# Patient Record
Sex: Male | Born: 1965 | Race: White | Hispanic: No | Marital: Married | Smoking: Former smoker
Health system: Southern US, Community
[De-identification: ages and names within clinical notes are randomized; demographics above are authoritative.]

## PROBLEM LIST (undated history)

## (undated) DIAGNOSIS — F419 Anxiety disorder, unspecified: Secondary | ICD-10-CM

## (undated) DIAGNOSIS — J309 Allergic rhinitis, unspecified: Secondary | ICD-10-CM

## (undated) DIAGNOSIS — F32A Depression, unspecified: Secondary | ICD-10-CM

## (undated) DIAGNOSIS — J45909 Unspecified asthma, uncomplicated: Secondary | ICD-10-CM

## (undated) DIAGNOSIS — T7840XA Allergy, unspecified, initial encounter: Secondary | ICD-10-CM

## (undated) DIAGNOSIS — F329 Major depressive disorder, single episode, unspecified: Secondary | ICD-10-CM

## (undated) HISTORY — DX: Anxiety disorder, unspecified: F41.9

## (undated) HISTORY — DX: Allergic rhinitis, unspecified: J30.9

## (undated) HISTORY — PX: COLONOSCOPY: SHX174

## (undated) HISTORY — PX: JOINT REPLACEMENT: SHX530

## (undated) HISTORY — DX: Major depressive disorder, single episode, unspecified: F32.9

## (undated) HISTORY — DX: Depression, unspecified: F32.A

## (undated) HISTORY — PX: NO PAST SURGERIES: SHX2092

## (undated) HISTORY — DX: Allergy, unspecified, initial encounter: T78.40XA

## (undated) HISTORY — DX: Unspecified asthma, uncomplicated: J45.909

---

## 2011-05-13 ENCOUNTER — Other Ambulatory Visit: Payer: Self-pay | Admitting: Physician Assistant

## 2011-06-21 ENCOUNTER — Other Ambulatory Visit: Payer: Self-pay | Admitting: Emergency Medicine

## 2011-09-16 ENCOUNTER — Other Ambulatory Visit: Payer: Self-pay | Admitting: Physician Assistant

## 2011-09-17 MED ORDER — MOMETASONE FUROATE 50 MCG/ACT NA SUSP: 2.0000 | Freq: Every day | NASAL | Status: DC

## 2011-09-17 NOTE — Addendum Note (Signed)
Addended by: Jacqualyn Posey on: 09/17/2011 04:05 PM   Modules accepted: Orders

## 2011-10-16 ENCOUNTER — Ambulatory Visit (INDEPENDENT_AMBULATORY_CARE_PROVIDER_SITE_OTHER): Payer: BC Managed Care – PPO | Admitting: Physician Assistant

## 2011-10-16 VITALS — BP 116/78 | HR 62 | Temp 98.3°F | Resp 16 | Ht 72.5 in | Wt 225.6 lb

## 2011-10-16 DIAGNOSIS — F341 Dysthymic disorder: Secondary | ICD-10-CM

## 2011-10-16 DIAGNOSIS — F418 Other specified anxiety disorders: Secondary | ICD-10-CM

## 2011-10-16 DIAGNOSIS — J309 Allergic rhinitis, unspecified: Secondary | ICD-10-CM

## 2011-10-16 MED ORDER — PAROXETINE HCL 20 MG PO TABS: 20.0000 mg | ORAL_TABLET | Freq: Every day | ORAL | Status: DC

## 2011-10-16 MED ORDER — MOMETASONE FUROATE 50 MCG/ACT NA SUSP: 2.0000 | Freq: Every day | NASAL | Status: DC

## 2011-10-16 NOTE — Progress Notes (Signed)
Patient ID: Doc Mandala MRN: [id number], DOB: October 18, 1965, 46 y.o. Date of Encounter: 10/16/2011, 11:01 AM  Primary Physician: No primary provider on file.  Chief Complaint: Medication refill  HPI: 46 y.o. year old male with history below presents for medication refill. 1) Depression with anxiety well controlled with current treatment. Taking Paxil 20 mg daily without issues. Has not needed Clonazepam recently. Staying busy at work and home. Has 4 children age ranges 57 to a few months old. Enjoying life. No SI/HI.  2) Allergic rhinitis well controlled with current treatment. Taking Nasonex 2 sprays daily without issues. Really helping control his allergies. No side effects.     Past Medical History  Diagnosis Date  . Anxiety   . Depression   . Allergic rhinitis      Home Meds: Prior to Admission medications   Medication Sig Start Date End Date Taking? Authorizing Provider  mometasone (NASONEX) 50 MCG/ACT nasal spray Place 2 sprays into the nose daily. 10/16/11 10/15/12 Yes Abigial Newville M Akya Fiorello, PA-C  PARoxetine (PAXIL) 20 MG tablet Take 1 tablet (20 mg total) by mouth daily. 10/16/11  Yes Lynnmarie Lovett M Dacari Beckstrand, PA-C    Allergies: No Known Allergies  History   Social History  . Marital Status: Married    Spouse Name: N/A    Number of Children: N/A  . Years of Education: N/A   Occupational History  . Not on file.   Social History Main Topics  . Smoking status: Former Games developer  . Smokeless tobacco: Not on file  . Alcohol Use: Not on file  . Drug Use: Not on file  . Sexually Active: Not on file   Other Topics Concern  . Not on file   Social History Narrative  . No narrative on file     Review of Systems: Constitutional: negative for chills, fever, night sweats, weight changes, or fatigue  HEENT: negative for vision changes, hearing loss, congestion, rhinorrhea, ST, epistaxis, or sinus pressure Cardiovascular: negative for chest pain or palpitations Respiratory: negative for  hemoptysis, wheezing, shortness of breath, or cough Abdominal: negative for abdominal pain, nausea, vomiting, diarrhea, or constipation Dermatological: negative for rash Neurologic: negative for headache, dizziness, or syncope All other systems reviewed and are otherwise negative with the exception to those above and in the HPI.   Physical Exam: Blood pressure 116/78, pulse 62, temperature 98.3 F (36.8 C), temperature source Oral, resp. rate 16, height 6' 0.5" (1.842 m), weight 225 lb 9.6 oz (102.331 kg), SpO2 98.00%., Body mass index is 30.18 kg/(m^2). General: Well developed, well nourished, in no acute distress. Head: Normocephalic, atraumatic, eyes without discharge, sclera non-icteric, nares are without discharge. Bilateral auditory canals clear, TM's are without perforation, pearly grey and translucent with reflective cone of light bilaterally. Oral cavity moist, posterior pharynx without exudate, erythema, peritonsillar abscess, or post nasal drip.  Neck: Supple. No thyromegaly. Full ROM. No lymphadenopathy. Lungs: Clear bilaterally to auscultation without wheezes, rales, or rhonchi. Breathing is unlabored. Heart: RRR with S1 S2. No murmurs, rubs, or gallops appreciated. Msk:  Strength and tone normal for age. Extremities/Skin: Warm and dry. No clubbing or cyanosis. No edema. No rashes or suspicious lesions. Neuro: Alert and oriented X 3. Moves all extremities spontaneously. Gait is normal. CNII-XII grossly in tact. Psych:  Responds to questions appropriately with a normal affect.     ASSESSMENT AND PLAN:  46 y.o. year old male with depression,anxiety, and allergic rhinitis 1. Depression/anxiety -Well controlled -Continue current treatment -Paxil 20 mg 1 po  daily #90 RF 4  2. Allergic rhinitis -Well controlled -Continue current treatment -Nasonex 2 sprays daily #1 RF 11  Signed, Eula Listen, PA-C 10/16/2011 11:01 AM

## 2011-11-18 ENCOUNTER — Ambulatory Visit (INDEPENDENT_AMBULATORY_CARE_PROVIDER_SITE_OTHER): Payer: BC Managed Care – PPO | Admitting: Family Medicine

## 2011-11-18 VITALS — BP 120/72 | HR 67 | Temp 98.2°F | Resp 16 | Ht 73.0 in | Wt 225.4 lb

## 2011-11-18 DIAGNOSIS — M659 Synovitis and tenosynovitis, unspecified: Secondary | ICD-10-CM

## 2011-11-18 DIAGNOSIS — M7750 Other enthesopathy of unspecified foot: Secondary | ICD-10-CM

## 2011-11-18 DIAGNOSIS — M65979 Unspecified synovitis and tenosynovitis, unspecified ankle and foot: Secondary | ICD-10-CM

## 2011-11-18 MED ORDER — PREDNISONE 20 MG PO TABS: ORAL_TABLET | ORAL | Status: DC

## 2011-11-18 NOTE — Progress Notes (Signed)
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  It worsened after kicking a soccer ball this weekend.  He has a h/o a fracture in that area. Has taken no meds  Objective:  NAD Normal inspection of foot.  Mildly tender over insertion of peroneus longus. Full ROM No bony abnormality.  Assessment: tendonitis  Plan:   1. Tendonitis of foot  predniSONE (DELTASONE) 20 MG tablet

## 2011-12-04 ENCOUNTER — Ambulatory Visit (INDEPENDENT_AMBULATORY_CARE_PROVIDER_SITE_OTHER): Payer: BC Managed Care – PPO | Admitting: Family Medicine

## 2011-12-04 VITALS — BP 111/63 | HR 69 | Temp 98.4°F | Resp 20 | Ht 73.0 in | Wt 223.0 lb

## 2011-12-04 DIAGNOSIS — G4700 Insomnia, unspecified: Secondary | ICD-10-CM

## 2011-12-04 DIAGNOSIS — F341 Dysthymic disorder: Secondary | ICD-10-CM

## 2011-12-04 DIAGNOSIS — F418 Other specified anxiety disorders: Secondary | ICD-10-CM | POA: Insufficient documentation

## 2011-12-04 MED ORDER — CLONAZEPAM 0.5 MG PO TABS: 0.5000 mg | ORAL_TABLET | Freq: Two times a day (BID) | ORAL | Status: DC | PRN

## 2011-12-04 NOTE — Progress Notes (Signed)
  Subjective:    Patient ID: Brian Morrison, male    DOB: 1965-10-19, 46 y.o.   MRN: [id number]  HPI Brian Morrison is a 46 y.o. male Hx of depression - last seen 10/16/11 - sx's controlled with paxil 20mg  qd at that time and had not needed clonazepam.    New baby at home - 20 month old, also has 15yo, 44yo, and 44 yo.  New addition on the house, marital problems. Strain of the new baby has been strain on marriage.  Only went to counseling years ago.  Would like to meet with a counselor.  Still taking paxil.  Has not taken clonazepam in awhile.   Alcohol - beer here and there - only one.  No recent increase in ETOH, no illicit drug use.  Trouble sleeping - getting to sleep, staying asleep, waking up thinking of stressors and difficult to go back to sleep.  Sales rep. Custom manufactured products.    Review of Systems  Psychiatric/Behavioral: Positive for disturbed wake/sleep cycle. Negative for suicidal ideas. The patient is nervous/anxious.        Objective:   Physical Exam  Constitutional: He appears well-developed.  HENT:  Head: Normocephalic and atraumatic.  Neck: No thyromegaly present.  Cardiovascular: Normal rate, regular rhythm, normal heart sounds and intact distal pulses.   Skin: Skin is warm and dry.  Psychiatric: He has a normal mood and affect. His speech is normal and behavior is normal. Judgment and thought content normal. Cognition and memory are normal. He expresses no suicidal ideation.       Somewhat flat affect, but good eye contact. Minimal PMA few times during visit.  No SI.           Assessment & Plan:  Brian Morrison is a 46 y.o. male 1. Depression with anxiety  clonazePAM (KLONOPIN) 0.5 MG tablet  2. Insomnia  clonazePAM (KLONOPIN) 0.5 MG tablet   Depression with anxiety - recent worsening with marital stressors, new baby. Insomnia.  Discussed stress management techniques, discussed counseling - individual and marital -amenable to individual  counseling at present.   Phone numbers given for [facility] rtc precautions.    Patient Instructions  You can call [facility] or [facility] at [phone].  Their office is at [address], [address]    Let me know if other names/numbers needed. Ou can take 1/2 up to 2 clonazepam at bedtime if needed. Work on the Medical illustrator we discussed.  Return to the clinic or go to the nearest emergency room if any of your symptoms worsen or new symptoms occur.

## 2011-12-04 NOTE — Patient Instructions (Addendum)
You can call [facility] or [facility] at [phone].  Their office is at [address], [address].    Let me know if other names/numbers needed. Ou can take 1/2 up to 2 clonazepam at bedtime if needed. Work on the Medical illustrator we discussed. If not improving in next few weeks, follow up to discuss further options. Return to the clinic or go to the nearest emergency room if any of your symptoms worsen or new symptoms occur.

## 2011-12-31 ENCOUNTER — Ambulatory Visit (INDEPENDENT_AMBULATORY_CARE_PROVIDER_SITE_OTHER): Payer: BC Managed Care – PPO | Admitting: Family Medicine

## 2011-12-31 VITALS — BP 132/82 | HR 96 | Temp 98.3°F | Resp 20 | Ht 73.0 in | Wt 216.4 lb

## 2011-12-31 DIAGNOSIS — J029 Acute pharyngitis, unspecified: Secondary | ICD-10-CM

## 2011-12-31 MED ORDER — AMOXICILLIN 875 MG PO TABS: 875.0000 mg | ORAL_TABLET | Freq: Two times a day (BID) | ORAL | Status: DC

## 2011-12-31 MED ORDER — MAGIC MOUTHWASH W/LIDOCAINE: 5.0000 mL | Freq: Four times a day (QID) | ORAL | Status: DC | PRN

## 2011-12-31 NOTE — Patient Instructions (Addendum)
Faringitis (viral y bacteriana) (Pharyngitis, Viral and Bacterial) Es una inflamacin (irritacin) o infeccin de la faringe. Tambin se denomina dolor de garganta.  CAUSAS La mayor parte de las anginas son causadas por virus y son parte de un resfro. Sin embargo, algunas anginas son ocasionadas por la bacteria estreptococo (germen) o por otras bacterias. La causa de la angina tambin puede ser el goteo nasal proveniente de los senos paranasales, alergias y, en algunos casos, se produce incluso por dormir con la boca abierta. Las anginas infecciosas pueden contagiarse de una persona a otra por la tos, el estornudo y por compartir tasas o cubiertos. TRATAMIENTO Las anginas de causa viral generalmente duran entre 3 y 4 das. Estas infecciones virales generalmente mejoran sin antibiticos (medicamentos que destruyen grmenes). Las anginas por estreptococo y otras bacterias (grmenes) generalmente mejoran despus de 24 a 48 horas de comenzar el tratamiento con antibiticos. INSTRUCCIONES PARA EL CUIDADO DOMICILIARIO  Si en profesional considera que se trata de una infeccin bacteriana o si hay una prueba positiva para el estreptococo, le prescribir un antibitico. Debe tomar todos los antibiticos hasta completar el tratamiento. Si no completa el tratamiento con antibiticos, usted o su hijo podrn enfermar nuevamente. Si usted o su hijo presentan un estreptococo en la garganta y no completan el tratamiento, podran sufrir un trastorno grave en el corazn o en los riones.   Beba gran cantidad de lquidos. Alrededor de 8-10 vasos grandes de agua por da. (Puede ser agua, jugos, licuados de frutas, Kool-aid, Gatorade, gaseosas, etc.).   Utilice los medicamentos de venta libre o de prescripcin para el dolor, el malestar o la fiebre, segn se lo indique el profesional que lo asiste.   Descanse lo suficiente.   Hgase grgaras con agua salada (1/2 cucharadita de sal en un vaso de agua) cada 1-2 horas si  lo necesita para aliviar las molestias.   Si el paciente es mayor de siete aos, ofrzcale caramelos duros o aerosoles o pastillas para la tos.  SOLICITE ATENCIN MDICA SI:  Si le aparecen bultos grandes y dolorosos en el cuello.   Presenta una erupcin.   Elimina un esputo verde, marrn-amarillento o sanguinolento.   El beb tiene ms de 3 meses y su temperatura rectal es de 100.5 F (38.1 C) o ms durante ms de 1 da.  SOLICITE ATENCIN MDICA DE INMEDIATO SI:  Siente rigidez en el cuello.   Usted o su hijo babean o no pueden tragar lquidos.   Usted o su hijo vomitan, no pueden retener los medicamentos o los lquidos.   Usted o su hijo presentan dolor intenso, que no se alivia con los medicamentos que le han recetado.   Usted o su hijo presentan dificultad para respirar (y no se debe a la nariz congestionada).   Usted o su hijo no pueden abrir la boca completamente.   Usted o su nio presentan aumento del dolor, hinchazn, enrojecimiento en el cuello.   Tiene fiebre.   Su beb tiene ms de 3 meses y su temperatura rectal es de 102 F (38.9 C) o ms.   Su beb tiene 3 meses o menos y su temperatura rectal es de 100.4 F (38 C) o ms.  EST SEGURO QUE:   Comprende las instrucciones para el alta mdica.   Controlar su enfermedad.   Solicitar atencin mdica de inmediato segn las indicaciones.  Document Released: 11/28/2004 Document Revised: 02/07/2011 ExitCare Patient Information 2012 [employer]. 

## 2011-12-31 NOTE — Progress Notes (Signed)
@[email or web address] @   Patient ID: Brian Morrison MRN: [id number], DOB: [birth year], 46 y.o. Date of Encounter: 12/31/2011, 11:06 AM  Primary Physician: Tally Due, MD  Chief Complaint:  Chief Complaint  Patient presents with  . Sore Throat    X4days (exposed to strep)  . Chills  . Fatigue  . Nasal Congestion    HPI: 46 y.o. year old male presents with 4 day history of sore throat. Subjective low grade temperature and chills. No cough, congestion, rhinorrhea, sinus pressure, otalgia, or headache. Normal hearing. No GI complaints. Able to swallow saliva, but hurts to do so. Decreased appetite secondary to sore throat.   Past Medical History  Diagnosis Date  . Anxiety   . Depression   . Allergic rhinitis   . Asthma      Home Meds: Prior to Admission medications   Medication Sig Start Date End Date Taking? Authorizing Provider  mometasone (NASONEX) 50 MCG/ACT nasal spray Place 2 sprays into the nose daily. 10/16/11 10/15/12 Yes Ryan M Dunn, PA-C  PARoxetine (PAXIL) 20 MG tablet Take 1 tablet (20 mg total) by mouth daily. 10/16/11  Yes Ryan M Dunn, PA-C  clonazePAM (KLONOPIN) 0.5 MG tablet Take 1 tablet (0.5 mg total) by mouth 2 (two) times daily as needed for anxiety. 12/04/11   Shade Flood, MD  predniSONE (DELTASONE) 20 MG tablet 2 daily with food 11/18/11   Elvina Sidle, MD    Allergies: No Known Allergies  History   Social History  . Marital Status: Married    Spouse Name: N/A    Number of Children: N/A  . Years of Education: N/A   Occupational History  . Not on file.   Social History Main Topics  . Smoking status: Former Smoker    Start date: 12/03/1997  . Smokeless tobacco: Never Used  . Alcohol Use: Not on file  . Drug Use: Not on file  . Sexually Active: Not on file   Other Topics Concern  . Not on file   Social History Narrative  . No narrative on file     Review of Systems: Constitutional: negative for chills, fever, night sweats or weight  changes HEENT: see above Cardiovascular: negative for chest pain or palpitations Respiratory: negative for hemoptysis, wheezing, or shortness of breath Abdominal: negative for abdominal pain, nausea, vomiting or diarrhea Dermatological: negative for rash Neurologic: negative for headache   Physical Exam: Blood pressure 132/82, pulse 96, temperature 98.3 F (36.8 C), temperature source Oral, resp. rate 20, height 6\' 1"  (1.854 m), weight 216 lb 6.4 oz (98.158 kg), SpO2 96.00%., Body mass index is 28.55 kg/(m^2). General: Well developed, well nourished, in no acute distress. Head: Normocephalic, atraumatic, eyes without discharge, sclera non-icteric, nares are patent. Bilateral auditory canals clear, TM's are without perforation, pearly grey with reflective cone of light bilaterally. No sinus TTP. Oral cavity moist, dentition normal. Posterior pharynx with post nasal drip and mild erythema. No peritonsillar abscess or tonsillar exudate. Neck: Supple. No thyromegaly. Full ROM. No lymphadenopathy. Lungs: Clear bilaterally to auscultation without wheezes, rales, or rhonchi. Breathing is unlabored. Heart: RRR with S1 S2. No murmurs, rubs, or gallops appreciated. Abdomen: Soft, non-tender, non-distended with normoactive bowel sounds. No hepatomegaly. No rebound/guarding. No obvious abdominal masses. Msk:  Strength and tone normal for age. Extremities: No clubbing or cyanosis. No edema. Neuro: Alert and oriented X 3. Moves all extremities spontaneously. CNII-XII grossly in tact. Psych:  Responds to questions appropriately with a normal affect.   Labs:  ASSESSMENT AND PLAN:  46 y.o. year old male with  - -Tylenol/Motrin prn -Rest/fluids -RTC precautions -RTC 3-5 days if no improvement  Signed, Elvina Sidle, MD 12/31/2011 11:06 AM

## 2012-01-11 ENCOUNTER — Ambulatory Visit: Payer: BC Managed Care – PPO

## 2012-01-11 ENCOUNTER — Ambulatory Visit (INDEPENDENT_AMBULATORY_CARE_PROVIDER_SITE_OTHER): Payer: BC Managed Care – PPO | Admitting: Family Medicine

## 2012-01-11 VITALS — BP 122/73 | HR 69 | Temp 98.1°F | Resp 16 | Ht 73.0 in | Wt 215.0 lb

## 2012-01-11 DIAGNOSIS — R05 Cough: Secondary | ICD-10-CM

## 2012-01-11 DIAGNOSIS — J40 Bronchitis, not specified as acute or chronic: Secondary | ICD-10-CM

## 2012-01-11 DIAGNOSIS — R059 Cough, unspecified: Secondary | ICD-10-CM

## 2012-01-11 DIAGNOSIS — J329 Chronic sinusitis, unspecified: Secondary | ICD-10-CM

## 2012-01-11 DIAGNOSIS — M7750 Other enthesopathy of unspecified foot: Secondary | ICD-10-CM

## 2012-01-11 LAB — POCT CBC
Granulocyte percent: 59.7 %G (ref 37–80)
HCT, POC: 50.6 % (ref 43.5–53.7)
Hemoglobin: 15.8 g/dL (ref 14.1–18.1)
Lymph, poc: 3.4 (ref 0.6–3.4)
MCH, POC: 29.5 pg (ref 27–31.2)
MCHC: 31.2 g/dL — AB (ref 31.8–35.4)
MCV: 94.6 fL (ref 80–97)
MID (cbc): 0.7 (ref 0–0.9)
MPV: 10.6 fL (ref 0–99.8)
POC Granulocyte: 6 (ref 2–6.9)
POC LYMPH PERCENT: 33.7 %L (ref 10–50)
POC MID %: 6.6 %M (ref 0–12)
Platelet Count, POC: 296 10*3/uL (ref 142–424)
RBC: 5.35 M/uL (ref 4.69–6.13)
RDW, POC: 13.4 %
WBC: 10.1 10*3/uL (ref 4.6–10.2)

## 2012-01-11 MED ORDER — PREDNISONE 20 MG PO TABS: ORAL_TABLET | ORAL | Status: DC

## 2012-01-11 MED ORDER — BENZONATATE 100 MG PO CAPS: ORAL_CAPSULE | ORAL | Status: DC

## 2012-01-11 MED ORDER — HYDROCODONE-HOMATROPINE 5-1.5 MG/5ML PO SYRP: 5.0000 mL | ORAL_SOLUTION | ORAL | Status: DC | PRN

## 2012-01-11 MED ORDER — AZITHROMYCIN 250 MG PO TABS: ORAL_TABLET | ORAL | Status: DC

## 2012-01-11 NOTE — Progress Notes (Signed)
Subjective: Has continued to be ill since been treated couple of weeks ago. He has had congestion, purulent drainage, postnasal drainage, and cough. Cough bothers him some at night. He has had a low-grade fever.  Objective: TMs normal. Nose mildly congested. Throat clear. Neck supple without significant nodes. Chest clear to auscultation. Heart regular without murmurs.  Assessment: Bronchitis Cough Sinusitis  Plan: Chest x-ray CBC Results for orders placed in visit on 01/11/12  POCT CBC      Component Value Range   WBC 10.1  4.6 - 10.2 K/uL   Lymph, poc 3.4  0.6 - 3.4   POC LYMPH PERCENT 33.7  10 - 50 %L   MID (cbc) 0.7  0 - 0.9   POC MID % 6.6  0 - 12 %M   POC Granulocyte 6.0  2 - 6.9   Granulocyte percent 59.7  37 - 80 %G   RBC 5.35  4.69 - 6.13 M/uL   Hemoglobin 15.8  14.1 - 18.1 g/dL   HCT, POC 82.9  56.2 - 53.7 %   MCV 94.6  80 - 97 fL   MCH, POC 29.5  27 - 31.2 pg   MCHC 31.2 (*) 31.8 - 35.4 g/dL   RDW, POC 13.0     Platelet Count, POC 296  142 - 424 K/uL   MPV 10.6  0 - 99.8 fL   [facility] reading (PRIMARY) by  Dr. Alwyn Ren NOrmal cxr. . See orders. zithromax prednisone

## 2012-01-11 NOTE — Patient Instructions (Signed)
Drink plenty of fluids  Use cough syrup at nighttime on weekends. TheTessalon can be used when you are at work or having to drive.  Return if worse.

## 2012-04-17 ENCOUNTER — Ambulatory Visit (INDEPENDENT_AMBULATORY_CARE_PROVIDER_SITE_OTHER): Payer: BC Managed Care – PPO | Admitting: Emergency Medicine

## 2012-04-17 VITALS — BP 120/77 | HR 77 | Temp 98.5°F | Resp 16 | Ht 74.0 in | Wt 223.0 lb

## 2012-04-17 DIAGNOSIS — F32A Depression, unspecified: Secondary | ICD-10-CM

## 2012-04-17 DIAGNOSIS — H6590 Unspecified nonsuppurative otitis media, unspecified ear: Secondary | ICD-10-CM

## 2012-04-17 DIAGNOSIS — F329 Major depressive disorder, single episode, unspecified: Secondary | ICD-10-CM

## 2012-04-17 DIAGNOSIS — H6592 Unspecified nonsuppurative otitis media, left ear: Secondary | ICD-10-CM

## 2012-04-17 DIAGNOSIS — F3289 Other specified depressive episodes: Secondary | ICD-10-CM

## 2012-04-17 MED ORDER — DULOXETINE HCL 30 MG PO CPEP: 30.0000 mg | ORAL_CAPSULE | Freq: Every day | ORAL | Status: DC

## 2012-04-17 MED ORDER — PSEUDOEPHEDRINE-GUAIFENESIN ER 60-600 MG PO TB12: 1.0000 | ORAL_TABLET | Freq: Two times a day (BID) | ORAL | Status: DC

## 2012-04-17 MED ORDER — AMOXICILLIN 500 MG PO CAPS: 500.0000 mg | ORAL_CAPSULE | Freq: Three times a day (TID) | ORAL | Status: DC

## 2012-04-17 NOTE — Progress Notes (Signed)
[facility] [address] [place] [address] [phone]  Date:  04/17/2012   Name:  Brian Morrison   DOB:  Aug 10, 1965   MRN:  [id number]  PCP:  Tally Due, MD    Chief Complaint: Otalgia   History of Present Illness:  Brian Morrison is a 47 y.o. very pleasant male patient who presents with the following:  Pain in left ear after using a q-tip to clean his ear. Some bloody drainage.  No fever or chills, cough or coryza.  Continues to be depressed and his therapist suggested that he change medications to wellbutrin   Patient Active Problem List  Diagnosis  . Depression with anxiety    Past Medical History  Diagnosis Date  . Anxiety   . Depression   . Allergic rhinitis   . Asthma     History reviewed. No pertinent past surgical history.  History  Substance Use Topics  . Smoking status: Former Smoker    Start date: 12/03/1997  . Smokeless tobacco: Never Used  . Alcohol Use: Yes    Family History  Problem Relation Age of Onset  . COPD Mother     No Known Allergies  Medication list has been reviewed and updated.  Current Outpatient Prescriptions on File Prior to Visit  Medication Sig Dispense Refill  . clonazePAM (KLONOPIN) 0.5 MG tablet Take 1 tablet (0.5 mg total) by mouth 2 (two) times daily as needed for anxiety.  20 tablet  1  . mometasone (NASONEX) 50 MCG/ACT nasal spray Place 2 sprays into the nose daily.  17 g  11  . PARoxetine (PAXIL) 20 MG tablet Take 1 tablet (20 mg total) by mouth daily.  90 tablet  4  . Alum & Mag Hydroxide-Simeth (MAGIC MOUTHWASH W/LIDOCAINE) SOLN Take 5 mLs by mouth 4 (four) times daily as needed.  120 mL  0  . amoxicillin (AMOXIL) 875 MG tablet Take 1 tablet (875 mg total) by mouth 2 (two) times daily.  20 tablet  0  . azithromycin (ZITHROMAX) 250 MG tablet Take 2 initiallly, then one daily for 4 days  6 tablet  0  . benzonatate (TESSALON) 100 MG capsule Use 1-2 tablets 3 times daily as  necessary for cough. May be used with other cough medicines if needed.  30 capsule  0  . HYDROcodone-homatropine (HYCODAN) 5-1.5 MG/5ML syrup Take 5 mLs by mouth every 4 (four) hours as needed for cough.  120 mL  0  . predniSONE (DELTASONE) 20 MG tablet 2 daily with food  10 tablet  1  . predniSONE (DELTASONE) 20 MG tablet Take 3 daily for 2 days, then 2 daily for 2 days, then 1 daily for 2 days  12 tablet  0   No current facility-administered medications on file prior to visit.    Review of Systems: As per HPI, otherwise negative.    Physical Examination: Filed Vitals:   04/17/12 1352  BP: 120/77  Pulse: 77  Temp: 98.5 F (36.9 C)  Resp: 16   Filed Vitals:   04/17/12 1352  Height: 6\' 2"  (1.88 m)  Weight: 223 lb (101.152 kg)   Body mass index is 28.62 kg/(m^2). Ideal Body Weight: Weight in (lb) to have BMI = 25: 194.3  GEN: WDWN, NAD, Non-toxic, A & O x 3 HEENT: Atraumatic, Normocephalic. Neck supple. No masses, No LAD. Ears and Nose: No external deformity.  Left ear drum dark and dull with effusion CV: RRR, No M/G/R. No  JVD. No thrill. No extra heart sounds. PULM: CTA B, no wheezes, crackles, rhonchi. No retractions. No resp. distress. No accessory muscle use. ABD: S, NT, ND, +BS. No rebound. No HSM. EXTR: No c/c/e NEURO Normal gait.  PSYCH: Normally interactive. Conversant. Not depressed or anxious appearing.  Calm demeanor.      Assessment and Plan: Left ear otitis media with effusion mucinex d Amoxicillin Change to cymbalta Stop paxil  Carmelina Dane, MD

## 2012-04-17 NOTE — Patient Instructions (Addendum)
Otitis Media with Effusion Otitis media with effusion is the presence of fluid in the middle ear. This is a common problem that often follows ear infections. It may be present for weeks or longer after the infection. Unlike an acute ear infection, otits media with effusion refers only to fluid behind the ear drum and not infection. Children with repeated ear and sinus infections and allergy problems are the most likely to get otitis media with effusion. CAUSES  The most frequent cause of the fluid buildup is dysfunction of the eustacian tubes. These are the tubes that drain fluid in the ears to the throat. SYMPTOMS   The main symptom of this condition is hearing loss. As a result, you or your child may:  Listen to the TV at a loud volume.  Not respond to questions.  Ask "what" often when spoken to.  There may be a sensation of fullness or pressure but usually not pain. DIAGNOSIS   Your caregiver will diagnose this condition by examining you or your child's ears.  Your caregiver may test the pressure in you or your child's ear with a tympanometer.  A hearing test may be conducted if the problem persists.  A caregiver will want to re-evaluate the condition periodically to see if it improves. TREATMENT   Treatment depends on the duration and the effects of the effusion.  Antibiotics, decongestants, nose drops, and cortisone-type drugs may not be helpful.  Children with persistent ear effusions may have delayed language. Children at risk for developmental delays in hearing, learning, and speech may require referral to a specialist earlier than children not at risk.  You or your child's caregiver may suggest a referral to an Ear, Nose, and Throat (ENT) surgeon for treatment. The following may help restore normal hearing:  Drainage of fluid.  Placement of ear tubes (tympanostomy tubes).  Removal of adenoids (adenoidectomy). HOME CARE INSTRUCTIONS   Avoid second hand  smoke.  Infants who are breast fed are less likely to have this condition.  Avoid feeding infants while laying flat.  Avoid known environmental allergens.  Be sure to see a caregiver or an ENT specialist for follow up.  Avoid people who are sick. SEEK MEDICAL CARE IF:   Hearing is not better in 3 months.  Hearing is worse.  Ear pain.  Drainage from the ear.  Dizziness. Document Released: 03/28/2004 Document Revised: 05/13/2011 Document Reviewed: 07/11/2009 [employer] Patient Information 2013 [employer].  

## 2012-05-07 ENCOUNTER — Ambulatory Visit (INDEPENDENT_AMBULATORY_CARE_PROVIDER_SITE_OTHER): Payer: BC Managed Care – PPO | Admitting: Physician Assistant

## 2012-05-07 VITALS — BP 124/84 | HR 60 | Temp 97.5°F | Resp 16 | Ht 73.0 in | Wt 218.0 lb

## 2012-05-07 DIAGNOSIS — H9209 Otalgia, unspecified ear: Secondary | ICD-10-CM

## 2012-05-07 DIAGNOSIS — H6692 Otitis media, unspecified, left ear: Secondary | ICD-10-CM

## 2012-05-07 DIAGNOSIS — H6690 Otitis media, unspecified, unspecified ear: Secondary | ICD-10-CM

## 2012-05-07 DIAGNOSIS — H9202 Otalgia, left ear: Secondary | ICD-10-CM

## 2012-05-07 MED ORDER — CEFDINIR 300 MG PO CAPS: 300.0000 mg | ORAL_CAPSULE | Freq: Two times a day (BID) | ORAL | Status: DC

## 2012-05-07 MED ORDER — HYDROCODONE-ACETAMINOPHEN 5-325 MG PO TABS: 1.0000 | ORAL_TABLET | Freq: Four times a day (QID) | ORAL | Status: DC | PRN

## 2012-05-07 NOTE — Progress Notes (Signed)
  Subjective:    Patient ID: Brian Morrison, male    DOB: 31-Jul-1965, 47 y.o.   MRN: [id number]  HPI 47 year old male presents with acute onset of left ear pain. States he was here on 04/17/12 and treated for an ear infection with amoxicillin.  States symptoms did improve but then he noticed episodic stabbing pains in his left ear.  These persisted for a few weeks and then last night he had sudden onset of severe left sided ear pain.  Took tylenol which helped slightly but did not take the pain away in total.  It has continued through this morning so he decided to come in for evaluation. No history of ear infections but he does have seasonal allergies for which he uses nasonex regularly.  Also has taken 1 dose of Mucinex.  Denies fever, chills, nausea, vomiting, nasal congestion, headache, dizziness, or drainage from affected ear. Does admit to slight cough.   Otherwise healthy with no other concerns today.    Works in Airline pilot.    Review of Systems  Constitutional: Negative for fever and chills.  HENT: Positive for ear pain (left sided). Negative for congestion, sore throat, rhinorrhea, postnasal drip and sinus pressure.   Respiratory: Positive for cough. Negative for shortness of breath and wheezing.   Gastrointestinal: Negative for nausea and vomiting.  Neurological: Negative for dizziness and headaches.       Objective:   Physical Exam  Constitutional: He is oriented to person, place, and time. He appears well-developed and well-nourished.  HENT:  Head: Normocephalic and atraumatic.  Right Ear: Hearing, tympanic membrane, external ear and ear canal normal.  Left Ear: Hearing, external ear and ear canal normal. No mastoid tenderness. Tympanic membrane is erythematous. Tympanic membrane is not perforated. A middle ear effusion is present.  Mouth/Throat: Uvula is midline, oropharynx is clear and moist and mucous membranes are normal.  Eyes: Conjunctivae are normal.  Neck: Normal range of  motion.  Cardiovascular: Normal rate, regular rhythm and normal heart sounds.   Pulmonary/Chest: Effort normal and breath sounds normal.  Lymphadenopathy:       Head (right side): No preauricular and no posterior auricular adenopathy present.       Head (left side): Preauricular (tender LAD) adenopathy present. No posterior auricular adenopathy present.    He has no cervical adenopathy.  Neurological: He is alert and oriented to person, place, and time.  Psychiatric: He has a normal mood and affect. His behavior is normal. Judgment and thought content normal.          Assessment & Plan:  1. Otitis media, left - Plan: cefdinir (OMNICEF) 300 MG capsule  -Will treat with Omnicef 300 mg bid x 10 days  -Continue Nasonex daily  -Sudafed x 2-3 days to help with ETD  -If symptoms return or fail to improve, may warrant referral to ENT for further evaluation and treatment.  2. Otalgia, left - Plan: HYDROcodone-acetaminophen (NORCO) 5-325 MG per tablet  -Norco 5/325 mg q8hours prn pain

## 2012-05-11 ENCOUNTER — Ambulatory Visit (INDEPENDENT_AMBULATORY_CARE_PROVIDER_SITE_OTHER): Payer: BC Managed Care – PPO | Admitting: Emergency Medicine

## 2012-05-11 ENCOUNTER — Telehealth: Payer: Self-pay

## 2012-05-11 VITALS — BP 128/84 | HR 62 | Temp 98.4°F | Resp 18 | Ht 73.0 in | Wt 215.8 lb

## 2012-05-11 DIAGNOSIS — H66009 Acute suppurative otitis media without spontaneous rupture of ear drum, unspecified ear: Secondary | ICD-10-CM

## 2012-05-11 DIAGNOSIS — F32A Depression, unspecified: Secondary | ICD-10-CM

## 2012-05-11 DIAGNOSIS — F3289 Other specified depressive episodes: Secondary | ICD-10-CM

## 2012-05-11 DIAGNOSIS — F329 Major depressive disorder, single episode, unspecified: Secondary | ICD-10-CM

## 2012-05-11 MED ORDER — DULOXETINE HCL 30 MG PO CPEP: 30.0000 mg | ORAL_CAPSULE | Freq: Every day | ORAL | Status: DC

## 2012-05-11 MED ORDER — MOMETASONE FUROATE 50 MCG/ACT NA SUSP: 2.0000 | Freq: Every day | NASAL | Status: DC

## 2012-05-11 NOTE — Patient Instructions (Signed)
Serous Otitis Media    Serous otitis media is also known as otitis media with effusion (OME). It means there is fluid in the middle ear space. This space contains the bones for hearing and air. Air in the middle ear space helps to transmit sound.    The air gets there through the eustachian tube. This tube goes from the back of the throat to the middle ear space. It keeps the pressure in the middle ear the same as the outside world. It also helps to drain fluid from the middle ear space.  CAUSES    OME occurs when the eustachian tube gets blocked. Blockage can come from:   Ear infections.   Colds and other upper respiratory infections.   Allergies.   Irritants such as cigarette smoke.   Sudden changes in air pressure (such as descending in an airplane).   Enlarged adenoids.  During colds and upper respiratory infections, the middle ear space can become temporarily filled with fluid. This can happen after an ear infection also. Once the infection clears, the fluid will generally drain out of the ear through the eustachian tube. If it does not, then OME occurs.  SYMPTOMS     Hearing loss.   A feeling of fullness in the ear  but no pain.   Young children may not show any symptoms.  DIAGNOSIS     Diagnosis of OME is made by an ear exam.   Tests may be done to check on the movement of the eardrum.   Hearing exams may be done.  TREATMENT     The fluid most often goes away without treatment.   If allergy is the cause, allergy treatment may be helpful.   Fluid that persists for several months may require minor surgery. A small tube is placed in the ear drum to:   Drain the fluid.   Restore the air in the middle ear space.   In certain situations, antibiotics are used to avoid surgery.   Surgery may be done to remove enlarged adenoids (if this is the cause).  HOME CARE INSTRUCTIONS     Keep children away from tobacco smoke.   Be sure to keep follow up appointments, if any.  SEEK MEDICAL CARE IF:      Hearing is not better in 3 months.   Hearing is worse.   Ear pain.   Drainage from the ear.   Dizziness.  Document Released: 05/11/2003 Document Revised: 05/13/2011 Document Reviewed: 03/10/2008  ExitCare Patient Information 2013 [employer].

## 2012-05-11 NOTE — Telephone Encounter (Signed)
He came in today to see Dr Dareen Piano today.

## 2012-05-11 NOTE — Telephone Encounter (Signed)
PT WAS JUST SEEN BY DR Dareen Piano AND HE THOUGHT FOR SURE HE WAS GIVEN HIM A DIFFERENT MEDICINE THAN WHAT HE WAS GIVEN PLEASE CALL [phone]

## 2012-05-11 NOTE — Telephone Encounter (Signed)
No additional.  thanks

## 2012-05-11 NOTE — Telephone Encounter (Signed)
Pt advised.

## 2012-05-11 NOTE — Progress Notes (Signed)
[facility] [address] [place] [address] [phone]  Date:  05/11/2012   Name:  Brian Morrison   DOB:  March 30, 1965   MRN:  [id number]  PCP:  Tally Due, MD    Chief Complaint: Otalgia   History of Present Illness:  Brian Morrison is a 47 y.o. very pleasant male patient who presents with the following:  Seen twice for left otitis media.  Last week put on omnicef and had transient improvement over the weekend.  Now has increased pain.  No fever or chills. No nausea or vomiting.  No cough or coryza.  Denies other complaint or health concern today. Has depression and is not responding well to paxil.  His therapist recommended a switch   Patient Active Problem List  Diagnosis  . Depression with anxiety    Past Medical History  Diagnosis Date  . Anxiety   . Depression   . Allergic rhinitis   . Asthma     History reviewed. No pertinent past surgical history.  History  Substance Use Topics  . Smoking status: Former Smoker    Start date: 12/03/1997  . Smokeless tobacco: Never Used  . Alcohol Use: Yes    Family History  Problem Relation Age of Onset  . COPD Mother     No Known Allergies  Medication list has been reviewed and updated.  Current Outpatient Prescriptions on File Prior to Visit  Medication Sig Dispense Refill  . cefdinir (OMNICEF) 300 MG capsule Take 1 capsule (300 mg total) by mouth 2 (two) times daily.  20 capsule  0  . HYDROcodone-acetaminophen (NORCO) 5-325 MG per tablet Take 1 tablet by mouth every 6 (six) hours as needed for pain.  30 tablet  0  . mometasone (NASONEX) 50 MCG/ACT nasal spray Place 2 sprays into the nose daily.  17 g  11  . PARoxetine (PAXIL) 20 MG tablet Take 1 tablet (20 mg total) by mouth daily.  90 tablet  4  . pseudoephedrine-guaifenesin (MUCINEX D) 60-600 MG per tablet Take 1 tablet by mouth every 12 (twelve) hours.  18 tablet  0  . Alum & Mag Hydroxide-Simeth (MAGIC MOUTHWASH  W/LIDOCAINE) SOLN Take 5 mLs by mouth 4 (four) times daily as needed.  120 mL  0  . amoxicillin (AMOXIL) 500 MG capsule Take 1 capsule (500 mg total) by mouth 3 (three) times daily.  30 capsule  0  . amoxicillin (AMOXIL) 875 MG tablet Take 1 tablet (875 mg total) by mouth 2 (two) times daily.  20 tablet  0  . azithromycin (ZITHROMAX) 250 MG tablet Take 2 initiallly, then one daily for 4 days  6 tablet  0  . benzonatate (TESSALON) 100 MG capsule Use 1-2 tablets 3 times daily as necessary for cough. May be used with other cough medicines if needed.  30 capsule  0  . clonazePAM (KLONOPIN) 0.5 MG tablet Take 1 tablet (0.5 mg total) by mouth 2 (two) times daily as needed for anxiety.  20 tablet  1  . DULoxetine (CYMBALTA) 30 MG capsule Take 1 capsule (30 mg total) by mouth daily.  30 capsule  3  . HYDROcodone-homatropine (HYCODAN) 5-1.5 MG/5ML syrup Take 5 mLs by mouth every 4 (four) hours as needed for cough.  120 mL  0  . predniSONE (DELTASONE) 20 MG tablet 2 daily with food  10 tablet  1  . predniSONE (DELTASONE) 20 MG tablet Take 3 daily for 2 days, then 2 daily  for 2 days, then 1 daily for 2 days  12 tablet  0   No current facility-administered medications on file prior to visit.    Review of Systems:  As per HPI, otherwise negative.    Physical Examination: Filed Vitals:   05/11/12 1026  BP: 128/84  Pulse: 62  Temp: 98.4 F (36.9 C)  Resp: 18   Filed Vitals:   05/11/12 1026  Height: 6\' 1"  (1.854 m)  Weight: 215 lb 12.8 oz (97.886 kg)   Body mass index is 28.48 kg/(m^2). Ideal Body Weight: Weight in (lb) to have BMI = 25: 189.1  GEN: WDWN, NAD, Non-toxic, A & O x 3 HEENT: Atraumatic, Normocephalic. Neck supple. No masses, No LAD. Ears and Nose: No external deformity.  TM intact and +valsalva CV: RRR, No M/G/R. No JVD. No thrill. No extra heart sounds. PULM: CTA B, no wheezes, crackles, rhonchi. No retractions. No resp. distress. No accessory muscle use. ABD: S, NT, ND, +BS.  No rebound. No HSM. EXTR: No c/c/e NEURO Normal gait.  PSYCH: Normally interactive. Conversant. Not depressed or anxious appearing.  Calm demeanor.    Assessment and Plan: Resolving left OM Add nasonex mucinex d Continue Brian Morrison, Tessa Lerner, MD

## 2012-05-11 NOTE — Telephone Encounter (Signed)
Patient has questions regarding his ear ache and the medication.   Please call [phone] (M)

## 2012-05-11 NOTE — Telephone Encounter (Signed)
Patient states he was given Rx for Nasonex, but he already uses this daily, do you want to change it to something else? Since he has already been using this, does not seem to be effective. Please advise. Amy

## 2012-05-11 NOTE — Progress Notes (Deleted)
  Subjective:    Patient ID: Brian Morrison, male    DOB: 05-05-1965, 47 y.o.   MRN: [id number]  HPI    Review of Systems     Objective:   Physical Exam        Assessment & Plan:

## 2012-08-24 ENCOUNTER — Other Ambulatory Visit: Payer: Self-pay | Admitting: Family Medicine

## 2012-08-25 NOTE — Telephone Encounter (Signed)
i refilled this medicine, but needs ov before further refills to discuss control of symptoms.

## 2012-10-06 ENCOUNTER — Ambulatory Visit (INDEPENDENT_AMBULATORY_CARE_PROVIDER_SITE_OTHER): Payer: BC Managed Care – PPO | Admitting: Family Medicine

## 2012-10-06 VITALS — BP 112/74 | HR 68 | Temp 97.9°F | Resp 18 | Ht 74.0 in | Wt 216.0 lb

## 2012-10-06 DIAGNOSIS — F329 Major depressive disorder, single episode, unspecified: Secondary | ICD-10-CM

## 2012-10-06 DIAGNOSIS — F3289 Other specified depressive episodes: Secondary | ICD-10-CM

## 2012-10-06 DIAGNOSIS — F32A Depression, unspecified: Secondary | ICD-10-CM

## 2012-10-06 MED ORDER — DULOXETINE HCL 60 MG PO CPEP: 60.0000 mg | ORAL_CAPSULE | Freq: Every day | ORAL | Status: DC

## 2012-10-06 NOTE — Progress Notes (Signed)
[facility]:  Office Visit  Chief Complaint:  Chief Complaint  Patient presents with  . discuss medications    cymbalta    HPI: Brian Morrison is a 47 y.o. male who complains of  Depression and was seen by therapist who recommends that he has an increase dose of the Cymbalta he is on. He is currently on 30 mg daily and she feels that he needs an increase dose due to mor stressors. He is taking hus Cymbalta regularly. More depressive, more anxiety due to stress at home and work Deneis SI/Hi or hallucinations.   Past Medical History  Diagnosis Date  . Anxiety   . Depression   . Allergic rhinitis   . Asthma    History reviewed. No pertinent past surgical history. History   Social History  . Marital Status: Married    Spouse Name: N/A    Number of Children: N/A  . Years of Education: N/A   Social History Main Topics  . Smoking status: Former Smoker    Start date: 12/03/1997  . Smokeless tobacco: Never Used  . Alcohol Use: Yes  . Drug Use: No  . Sexually Active: None   Other Topics Concern  . None   Social History Narrative  . None   Family History  Problem Relation Age of Onset  . COPD Mother    No Known Allergies Prior to Admission medications   Medication Sig Start Date End Date Taking? Authorizing Provider  clonazePAM (KLONOPIN) 0.5 MG tablet TAKE ONE TABLET BY MOUTH TWICE DAILY AS NEEDED 08/24/12  Yes Shade Flood, MD  DULoxetine (CYMBALTA) 30 MG capsule Take 1 capsule (30 mg total) by mouth daily. 04/17/12  Yes Phillips Odor, MD  mometasone (NASONEX) 50 MCG/ACT nasal spray Place 2 sprays into the nose daily. 10/16/11 10/15/12 Yes Ryan M Dunn, PA-C     ROS: The patient denies fevers, chills, night sweats, unintentional weight loss, chest pain, palpitations, wheezing, dyspnea on exertion, nausea, vomiting, abdominal pain, dysuria, hematuria, melena, numbness, weakness, or tingling.   All other systems have been reviewed and were  otherwise negative with the exception of those mentioned in the HPI and as above.    PHYSICAL EXAM: Filed Vitals:   10/06/12 1721  BP: 112/74  Pulse: 68  Temp: 97.9 F (36.6 C)  Resp: 18   Filed Vitals:   10/06/12 1721  Height: 6\' 2"  (1.88 m)  Weight: 216 lb (97.977 kg)   Body mass index is 27.72 kg/(m^2).  General: Alert, no acute distress HEENT:  Normocephalic, atraumatic, oropharynx patent. EOMI Cardiovascular:  Regular rate and rhythm, no rubs murmurs or gallops.  No Carotid bruits, radial pulse intact. No pedal edema.  Respiratory: Clear to auscultation bilaterally.  No wheezes, rales, or rhonchi.  No cyanosis, no use of accessory musculature GI: No organomegaly, abdomen is soft and non-tender, positive bowel sounds.  No masses. Skin: No rashes. Neurologic: Facial musculature symmetric. Psychiatric: Patient is appropriate throughout our interaction. Lymphatic: No cervical lymphadenopathy Musculoskeletal: Gait intact.   LABS: Results for orders placed in visit on 01/11/12  POCT CBC      Result Value Range   WBC 10.1  4.6 - 10.2 K/uL   Lymph, poc 3.4  0.6 - 3.4   POC LYMPH PERCENT 33.7  10 - 50 %L   MID (cbc) 0.7  0 - 0.9   POC MID % 6.6  0 - 12 %M   POC Granulocyte 6.0  2 - 6.9  Granulocyte percent 59.7  37 - 80 %G   RBC 5.35  4.69 - 6.13 M/uL   Hemoglobin 15.8  14.1 - 18.1 g/dL   HCT, POC 21.3  08.6 - 53.7 %   MCV 94.6  80 - 97 fL   MCH, POC 29.5  27 - 31.2 pg   MCHC 31.2 (*) 31.8 - 35.4 g/dL   RDW, POC 57.8     Platelet Count, POC 296  142 - 424 K/uL   MPV 10.6  0 - 99.8 fL     EKG/XRAY:   Primary read interpreted by Dr. Conley Rolls at [facility].   ASSESSMENT/PLAN: Encounter Diagnosis  Name Primary?  . Depression Yes   Rx Cymbalta 60 mg daily DC Cymbalta 30 mg F/u in 3 month since he has been on meds before and we are just changing the dose, if he has any new sxs or worsening sxs he will come in sooner to see [place] Gross sideeffects, risk and benefits, and  alternatives of medications d/w patient. Patient is aware that all medications have potential sideeffects and we are unable to predict every sideeffect or drug-drug interaction that may occur. C/w Therapy    Darnel Mchan PHUONG, DO 10/06/2012 6:56 PM

## 2012-10-21 ENCOUNTER — Other Ambulatory Visit: Payer: Self-pay | Admitting: Physician Assistant

## 2012-10-23 ENCOUNTER — Telehealth: Payer: Self-pay

## 2012-10-23 MED ORDER — FLUTICASONE PROPIONATE 50 MCG/ACT NA SUSP: 2.0000 | Freq: Every day | NASAL | Status: DC

## 2012-10-23 NOTE — Telephone Encounter (Signed)
Pt.notified

## 2012-10-23 NOTE — Telephone Encounter (Signed)
Patient is requesting an alternate form/ generic of Nasonex because he says its starting to increase in price even with his insurance and discount card. He says its gone up to $70 for Nasonex. Please advise.   [facility] [address] /[place] location  [phone]

## 2012-10-23 NOTE — Telephone Encounter (Signed)
Unfortunately there is not a generic for nasonex.  Will send fluticasone (generic flonase)

## 2012-11-16 ENCOUNTER — Other Ambulatory Visit: Payer: Self-pay | Admitting: Family Medicine

## 2012-11-19 ENCOUNTER — Telehealth: Payer: Self-pay

## 2012-11-19 NOTE — Telephone Encounter (Signed)
Notified pt that Rf was called in for his clonazepam and reminded him to f/up in early Nov. Pt agreed.

## 2012-11-19 NOTE — Telephone Encounter (Signed)
Patient states that [facility] needs prior authorization for Klonopin. [phone]

## 2013-01-06 ENCOUNTER — Ambulatory Visit (INDEPENDENT_AMBULATORY_CARE_PROVIDER_SITE_OTHER): Payer: BC Managed Care – PPO | Admitting: Family Medicine

## 2013-01-06 VITALS — BP 128/80 | HR 72 | Temp 98.0°F | Resp 16 | Ht 73.0 in | Wt 209.0 lb

## 2013-01-06 DIAGNOSIS — F329 Major depressive disorder, single episode, unspecified: Secondary | ICD-10-CM

## 2013-01-06 DIAGNOSIS — F3289 Other specified depressive episodes: Secondary | ICD-10-CM

## 2013-01-06 DIAGNOSIS — F32A Depression, unspecified: Secondary | ICD-10-CM

## 2013-01-06 DIAGNOSIS — F411 Generalized anxiety disorder: Secondary | ICD-10-CM

## 2013-01-06 MED ORDER — DULOXETINE HCL 60 MG PO CPEP: 60.0000 mg | ORAL_CAPSULE | Freq: Every day | ORAL | Status: DC

## 2013-01-06 NOTE — Patient Instructions (Signed)
Continue current medications  Encourage regular physical activity  Continue seeing your psychologist  Return at any time if problems, otherwise in about 4 months return for your medications are gone.

## 2013-01-06 NOTE — Progress Notes (Signed)
Subjective: Patient continues to see a Veterinary surgeon. He has done much better on the Cymbalta 60. He needs a refill. No new complaints. He has not get a lot of regular exercise. He is under some stress with his job.  Objective: Physical exam not done today  This assessment: Anxiety Depression  Plan: Continue current medication. I think he should probably be on the Cymbalta 60 for about 6 months to probably a year before trying to decrease dose. He is return 4 months.

## 2013-05-05 ENCOUNTER — Ambulatory Visit (INDEPENDENT_AMBULATORY_CARE_PROVIDER_SITE_OTHER): Payer: BC Managed Care – PPO | Admitting: Emergency Medicine

## 2013-05-05 VITALS — BP 128/76 | HR 78 | Temp 98.3°F | Resp 18 | Ht 73.0 in | Wt 214.0 lb

## 2013-05-05 DIAGNOSIS — F329 Major depressive disorder, single episode, unspecified: Secondary | ICD-10-CM

## 2013-05-05 DIAGNOSIS — F3289 Other specified depressive episodes: Secondary | ICD-10-CM

## 2013-05-05 DIAGNOSIS — F32A Depression, unspecified: Secondary | ICD-10-CM

## 2013-05-05 MED ORDER — DULOXETINE HCL 60 MG PO CPEP
60.0000 mg | ORAL_CAPSULE | Freq: Every day | ORAL | Status: DC
Start: 2013-05-05 — End: 2014-04-20

## 2013-05-05 NOTE — Patient Instructions (Signed)

## 2013-05-05 NOTE — Progress Notes (Signed)
   Subjective:    Patient ID: Brian Morrison, male    DOB: Jul 23, 1965, 48 y.o.   MRN: [id number]  HPI Pt presents today with medication refills. Currently taking cymbalta 60mg  he takes it for anxiety. Pt wants to try to wing off of cymbalta. He does mild exercising. Only side affects from cymbalta is that it makes pt a little fatigue. Was on 30mg  a day but got put on 60mg  a day and does the pt much better.       Review of Systems     Objective:   Physical Exam Patient is alert and cooperative and conversant. HEENT exam is unremarkable neck is supple chest is clear heart regular rate no murmurs       Assessment & Plan:  Cymbalta was refilled for one year. He was instructed that I would need to see him if he decided to go off his medications so we can appropriately taper him off.

## 2013-10-11 ENCOUNTER — Ambulatory Visit (INDEPENDENT_AMBULATORY_CARE_PROVIDER_SITE_OTHER): Payer: BC Managed Care – PPO | Admitting: Family Medicine

## 2013-10-11 VITALS — BP 120/80 | HR 65 | Temp 97.3°F | Resp 16 | Ht 73.0 in | Wt 224.2 lb

## 2013-10-11 DIAGNOSIS — G4700 Insomnia, unspecified: Secondary | ICD-10-CM

## 2013-10-11 DIAGNOSIS — H6690 Otitis media, unspecified, unspecified ear: Secondary | ICD-10-CM

## 2013-10-11 DIAGNOSIS — H6692 Otitis media, unspecified, left ear: Secondary | ICD-10-CM

## 2013-10-11 DIAGNOSIS — H9209 Otalgia, unspecified ear: Secondary | ICD-10-CM

## 2013-10-11 DIAGNOSIS — H9202 Otalgia, left ear: Secondary | ICD-10-CM

## 2013-10-11 MED ORDER — CLONAZEPAM 0.5 MG PO TABS: ORAL_TABLET | ORAL | Status: DC

## 2013-10-11 MED ORDER — AZITHROMYCIN 250 MG PO TABS
ORAL_TABLET | ORAL | Status: DC
Start: 2013-10-11 — End: 2014-06-28

## 2013-10-11 NOTE — Patient Instructions (Signed)
Otitis Media Otitis media is redness, soreness, and inflammation of the middle ear. Otitis media may be caused by allergies or, most commonly, by infection. Often it occurs as a complication of the common cold. SIGNS AND SYMPTOMS Symptoms of otitis media may include:  Earache.  Fever.  Ringing in your ear.  Headache.  Leakage of fluid from the ear. DIAGNOSIS To diagnose otitis media, your health care provider will examine your ear with an otoscope. This is an instrument that allows your health care provider to see into your ear in order to examine your eardrum. Your health care provider also will ask you questions about your symptoms. TREATMENT  Typically, otitis media resolves on its own within 3-5 days. Your health care provider may prescribe medicine to ease your symptoms of pain. If otitis media does not resolve within 5 days or is recurrent, your health care provider may prescribe antibiotic medicines if he or she suspects that a bacterial infection is the cause. HOME CARE INSTRUCTIONS   If you were prescribed an antibiotic medicine, finish it all even if you start to feel better.  Take medicines only as directed by your health care provider.  Keep all follow-up visits as directed by your health care provider. SEEK MEDICAL CARE IF:  You have otitis media only in one ear, or bleeding from your nose, or both.  You notice a lump on your neck.  You are not getting better in 3-5 days.  You feel worse instead of better. SEEK IMMEDIATE MEDICAL CARE IF:   You have pain that is not controlled with medicine.  You have swelling, redness, or pain around your ear or stiffness in your neck.  You notice that part of your face is paralyzed.  You notice that the bone behind your ear (mastoid) is tender when you touch it. MAKE SURE YOU:   Understand these instructions.  Will watch your condition.  Will get help right away if you are not doing well or get worse. Document Released:  11/24/2003 Document Revised: 07/05/2013 Document Reviewed: 09/15/2012 ExitCare Patient Information 2015 [employer]. This information is not intended to replace advice given to you by your health care provider. Make sure you discuss any questions you have with your health care provider.  

## 2013-10-11 NOTE — Progress Notes (Signed)
Chief Complaint:  Chief Complaint  Patient presents with  . Otalgia    left ear constant pain x2 days; pt says it feels like popping; pt has hx of ear infections;  pt states he has had chills but no fever;  OTC Tylenol with little relief    HPI: Brian Morrison is a 48 y.o. male who is here for: 1. Left ear pain, constat x 2 days, no fevers but has had chills. HAs a history of ear infections. Tried otc tylenol. He has no ringing or hearing problmes. HAs not bee in pool in several weeks. HAs a history of TMJ so thought it was that but  2. He takes cymbalta and is doing well 3. He needs refills on klonazepam for insomnia, 1x every 2-3 weeks. No SEs   Past Medical History  Diagnosis Date  . Anxiety   . Depression   . Allergic rhinitis   . Asthma    History reviewed. No pertinent past surgical history. History   Social History  . Marital Status: Married    Spouse Name: N/A    Number of Children: N/A  . Years of Education: N/A   Social History Main Topics  . Smoking status: Former Smoker    Start date: 12/03/1997  . Smokeless tobacco: Never Used  . Alcohol Use: Yes  . Drug Use: No  . Sexual Activity: None   Other Topics Concern  . None   Social History Narrative  . None   Family History  Problem Relation Age of Onset  . COPD Mother    No Known Allergies Prior to Admission medications   Medication Sig Start Date End Date Taking? Authorizing Provider  clonazePAM (KLONOPIN) 0.5 MG tablet TAKE ONE TABLET BY MOUTH TWICE DAILY AS NEEDED 11/16/12  Yes Axzel Rockhill P Issak Goley, DO  DULoxetine (CYMBALTA) 60 MG capsule Take 1 capsule (60 mg total) by mouth daily. 05/05/13  Yes Darlyne Russian, MD  fluticasone (FLONASE) 50 MCG/ACT nasal spray Place 2 sprays into the nose daily. 10/23/12  Yes Eleanore E Elana Alm, PA-C     ROS: The patient denies fevers, chills, night sweats, unintentional weight loss, chest pain, palpitations, wheezing, dyspnea on exertion, nausea, vomiting, abdominal pain,  dysuria, hematuria, melena, numbness, weakness, or tingling.   All other systems have been reviewed and were otherwise negative with the exception of those mentioned in the HPI and as above.    PHYSICAL EXAM: Filed Vitals:   10/11/13 0955  BP: 120/80  Pulse: 65  Temp: 97.3 F (36.3 C)  Resp: 16   Filed Vitals:   10/11/13 0955  Height: 6\' 1"  (1.854 m)  Weight: 224 lb 3.2 oz (101.696 kg)   Body mass index is 29.59 kg/(m^2).  General: Alert, no acute distress HEENT:  Normocephalic, atraumatic, oropharynx patent. EOMI, PERRLA. LEft TM blocked, after cerumen disimpaction, TM is dull erythematous , no dc Cardiovascular:  Regular rate and rhythm, no rubs murmurs or gallops.  No Carotid bruits, radial pulse intact. No pedal edema.  Respiratory: Clear to auscultation bilaterally.  No wheezes, rales, or rhonchi.  No cyanosis, no use of accessory musculature GI: No organomegaly, abdomen is soft and non-tender, positive bowel sounds.  No masses. Skin: No rashes. Neurologic: Facial musculature symmetric. Psychiatric: Patient is appropriate throughout our interaction. Lymphatic: No cervical lymphadenopathy Musculoskeletal: Gait intact.   LABS:    EKG/XRAY:   Primary read interpreted by Dr. Marin Comment at [facility]   ASSESSMENT/PLAN: Encounter Diagnoses  Name Primary?  Marland Kitchen  Otalgia, left Yes  . Insomnia   . Left otitis media, recurrence not specified, unspecified chronicity, unspecified otitis media type    HE states PCN does not work for his ears, either zpack or omnicef RX z pack Refilled klonopin F/u prn  Gross sideeffects, risk and benefits, and alternatives of medications d/w patient. Patient is aware that all medications have potential sideeffects and we are unable to predict every sideeffect or drug-drug interaction that may occur.  Francisco Eyerly, Burns, DO 10/14/2013 11:56 AM

## 2013-11-05 ENCOUNTER — Telehealth: Payer: Self-pay

## 2013-11-05 NOTE — Telephone Encounter (Signed)
Patient called to get refill on:  fluticasone (FLONASE) 50 MCG/ACT nasal spray [[id number]] He says his pharamcy sent a request a week ago and still has not gotten a response. Please call if he needs to RTC. Patient wants to know. Thank you!  [phone]

## 2013-11-06 MED ORDER — FLUTICASONE PROPIONATE 50 MCG/ACT NA SUSP: 2.0000 | Freq: Every day | NASAL | Status: DC

## 2013-11-06 NOTE — Telephone Encounter (Signed)
No request from his pharmacy is found.   Meds ordered this encounter  Medications  . fluticasone (FLONASE) 50 MCG/ACT nasal spray    Sig: Place 2 sprays into both nostrils daily.    Dispense:  16 g    Refill:  12    Order Specific Question:  Supervising Provider    Answer:  Laney Pastor, ROBERT P [[id number]]

## 2013-11-08 NOTE — Telephone Encounter (Signed)
Notified pt RFs sent. 

## 2014-02-23 ENCOUNTER — Other Ambulatory Visit: Payer: Self-pay | Admitting: Family Medicine

## 2014-02-24 NOTE — Telephone Encounter (Signed)
Faxed

## 2014-04-20 ENCOUNTER — Other Ambulatory Visit: Payer: Self-pay | Admitting: Emergency Medicine

## 2014-05-15 ENCOUNTER — Other Ambulatory Visit: Payer: Self-pay | Admitting: Emergency Medicine

## 2014-06-22 ENCOUNTER — Other Ambulatory Visit: Payer: Self-pay | Admitting: Family Medicine

## 2014-06-22 ENCOUNTER — Other Ambulatory Visit: Payer: Self-pay | Admitting: Internal Medicine

## 2014-06-22 NOTE — Telephone Encounter (Signed)
Spoke to pt to advise he is overdue for f/up. He stated he was not aware and is travelling the next couple of days, but can then plan to come in. Dr Marin Comment, I gave him a 7 day RF of citalopram so he wouldn't run out. Do you want to give any of this or wait until he comes in?

## 2014-06-22 NOTE — Telephone Encounter (Signed)
Spoke to pt to advise he is overdue for f/up. He stated he was not aware and is travelling the next couple of days, but can then plan to come in. I will send in 7 day RF.

## 2014-06-23 NOTE — Telephone Encounter (Signed)
Called in rx and lmom to notify pt.

## 2014-06-23 NOTE — Telephone Encounter (Signed)
Meds ordered this encounter  Medications  . clonazePAM (KLONOPIN) 1 MG tablet    Sig: TAKE 1/2 TABLET BY MOUTH TWICE DAILY AS NEEDED    Dispense:  7 tablet    Refill:  0

## 2014-06-28 ENCOUNTER — Ambulatory Visit (INDEPENDENT_AMBULATORY_CARE_PROVIDER_SITE_OTHER): Payer: BLUE CROSS/BLUE SHIELD | Admitting: Family Medicine

## 2014-06-28 VITALS — BP 112/82 | HR 85 | Temp 98.3°F | Resp 20 | Ht 73.0 in | Wt 222.8 lb

## 2014-06-28 DIAGNOSIS — Z136 Encounter for screening for cardiovascular disorders: Secondary | ICD-10-CM | POA: Diagnosis not present

## 2014-06-28 DIAGNOSIS — Z1383 Encounter for screening for respiratory disorder NEC: Secondary | ICD-10-CM | POA: Diagnosis not present

## 2014-06-28 DIAGNOSIS — Z1389 Encounter for screening for other disorder: Secondary | ICD-10-CM

## 2014-06-28 DIAGNOSIS — M7541 Impingement syndrome of right shoulder: Secondary | ICD-10-CM | POA: Diagnosis not present

## 2014-06-28 DIAGNOSIS — Z125 Encounter for screening for malignant neoplasm of prostate: Secondary | ICD-10-CM

## 2014-06-28 DIAGNOSIS — G4700 Insomnia, unspecified: Secondary | ICD-10-CM | POA: Diagnosis not present

## 2014-06-28 DIAGNOSIS — F411 Generalized anxiety disorder: Secondary | ICD-10-CM | POA: Diagnosis not present

## 2014-06-28 LAB — COMPREHENSIVE METABOLIC PANEL
ALT: 41 U/L (ref 0–53)
AST: 23 U/L (ref 0–37)
Albumin: 4.8 g/dL (ref 3.5–5.2)
Alkaline Phosphatase: 70 U/L (ref 39–117)
BUN: 13 mg/dL (ref 6–23)
CO2: 20 mEq/L (ref 19–32)
Calcium: 9.6 mg/dL (ref 8.4–10.5)
Chloride: 104 mEq/L (ref 96–112)
Creat: 0.91 mg/dL (ref 0.50–1.35)
Glucose, Bld: 97 mg/dL (ref 70–99)
Potassium: 4.5 mEq/L (ref 3.5–5.3)
Sodium: 137 mEq/L (ref 135–145)
Total Bilirubin: 0.9 mg/dL (ref 0.2–1.2)
Total Protein: 7.7 g/dL (ref 6.0–8.3)

## 2014-06-28 LAB — LIPID PANEL
Cholesterol: 222 mg/dL — ABNORMAL HIGH (ref 0–200)
HDL: 41 mg/dL (ref >=40)
LDL Cholesterol: 141 mg/dL — ABNORMAL HIGH (ref 0–99)
Total CHOL/HDL Ratio: 5.4 Ratio
Triglycerides: 201 mg/dL — ABNORMAL HIGH (ref <150)
VLDL: 40 mg/dL (ref 0–40)

## 2014-06-28 LAB — CBC
HCT: 49 % (ref 39.0–52.0)
Hemoglobin: 17 g/dL (ref 13.0–17.0)
MCH: 29.8 pg (ref 26.0–34.0)
MCHC: 34.7 g/dL (ref 30.0–36.0)
MCV: 86 fL (ref 78.0–100.0)
MPV: 11 fL (ref 8.6–12.4)
Platelets: 277 10*3/uL (ref 150–400)
RBC: 5.7 MIL/uL (ref 4.22–5.81)
RDW: 13.8 % (ref 11.5–15.5)
WBC: 7.8 10*3/uL (ref 4.0–10.5)

## 2014-06-28 LAB — TSH: TSH: 2.058 u[IU]/mL (ref 0.350–4.500)

## 2014-06-28 MED ORDER — MELOXICAM 15 MG PO TABS
15.0000 mg | ORAL_TABLET | Freq: Every day | ORAL | Status: DC | PRN
Start: 2014-06-28 — End: 2014-08-26

## 2014-06-28 MED ORDER — CLONAZEPAM 1 MG PO TABS: 0.5000 mg | ORAL_TABLET | Freq: Two times a day (BID) | ORAL | Status: DC | PRN

## 2014-06-28 MED ORDER — DULOXETINE HCL 60 MG PO CPEP
60.0000 mg | ORAL_CAPSULE | Freq: Every day | ORAL | Status: DC
Start: 2014-06-28 — End: 2015-06-16

## 2014-06-28 NOTE — Progress Notes (Signed)
Subjective:  This chart was scribed for Delman Cheadle, MD by Donato Schultz, Medical Scribe. This patient was seen in Room 3 and the patient's care was started at 9:18 AM.   Patient ID: Brian Morrison, male    DOB: May 20, 1965, 49 y.o.   MRN: [id number]  Chief Complaint  Patient presents with  . Follow-up    Discuss medication and medication refill Clonazepam, DULoxetine    HPI HPI Comments: Brian Morrison is a 49 y.o. male who presents to the [facility] to get a refill of his Klonopin and Cymbalta.  He was last seen here 8 months ago and had Klonopin refilled at that time.  He is currently taking 0.5mg  #30 Klonopin PRN to treat insomnia with no refills.  He does not use any OTC sleep medication or herbal supplements to treat his insomnia.  He denies panic attacks or mood instability as associated symptoms.  He has been on 60mg  Cymbalta for several years to treat anxiety after discontinuing Paxil.  He has not been taking the Cymbalta for more than 5 years.  Last year he was considering weaning off of the Cymbalta as it was causing some fatigue.  He states that his therapist does not believe that this is the right time to start weaning off of the Cymbalta.  The medication he is taking controls his symptoms and he plans on continuing his therapy sessions.    He is also complaining of constant right arm and right shoulder pain that started years ago.  He denies any recent or distant history of shoulder injuries.  Movement aggravates his symptoms.  He states that he carries his 30 year old daughter on his left shoulder.  He is right hand dominant.  He denies swelling and erythema as associated symptoms.    He does not have a specific PCP here at the clinic.     Past Medical History  Diagnosis Date  . Anxiety   . Depression   . Allergic rhinitis   . Asthma    History reviewed. No pertinent past surgical history. Family History  Problem Relation Age of Onset  . COPD Mother     History   Social History  . Marital Status: Married    Spouse Name: N/A  . Number of Children: N/A  . Years of Education: N/A   Occupational History  . Not on file.   Social History Main Topics  . Smoking status: Former Smoker    Start date: 12/03/1997  . Smokeless tobacco: Never Used  . Alcohol Use: Yes  . Drug Use: No  . Sexual Activity: Not on file   Other Topics Concern  . Not on file   Social History Narrative   No Known Allergies  Review of Systems  Constitutional: Negative for chills, diaphoresis, activity change, appetite change, fatigue and unexpected weight change.  Respiratory: Negative for shortness of breath.   Cardiovascular: Negative for chest pain and leg swelling.  Gastrointestinal: Negative for nausea and vomiting.  Musculoskeletal: Positive for arthralgias. Negative for myalgias, joint swelling and neck pain.  Skin: Negative for color change, rash and wound.  Neurological: Negative for dizziness, tremors, facial asymmetry, weakness, light-headedness, numbness and headaches.  Psychiatric/Behavioral: Positive for sleep disturbance. Negative for behavioral problems, dysphoric mood and agitation. The patient is nervous/anxious.      Objective:  BP 112/82 mmHg  Pulse 85  Temp(Src) 98.3 F (36.8 C) (Oral)  Resp 20  Ht 6\' 1"  (1.854 m)  Wt  222 lb 12.8 oz (101.061 kg)  BMI 29.40 kg/m2  SpO2 98%   Physical Exam  Constitutional: He is oriented to person, place, and time. He appears well-developed and well-nourished.  HENT:  Head: Normocephalic and atraumatic.  Eyes: EOM are normal.  Neck: Normal range of motion. Neck supple. No thyromegaly present.  Cardiovascular: Normal rate, regular rhythm, S1 normal, S2 normal and normal heart sounds.  Exam reveals no gallop and no friction rub.   No murmur heard. Pulmonary/Chest: Effort normal and breath sounds normal. No respiratory distress. He has no wheezes. He has no rales.  Lungs clear to auscultation  bilaterally.  Musculoskeletal: Normal range of motion.  No point tenderness to the right shoulder.  No clavicle or AC joint pain or effusion.  No tenderness over tip of the chromium or blade of scapula.  Significant pain over the deltoid down the lateral aspect of the shoulder with extremes of internal and external rotation with some mild loss of strength.  Left shoulder with full active range of motion.  Very slightly reduced ROM with active and passive abduction and flexion in the right arm.  Positive empty can test.  Positive Neer's and Hawkins.      Neurological: He is alert and oriented to person, place, and time.  Deltoid, bicep, triceps 5/5 strength bilaterally.    Skin: Skin is warm and dry.  Psychiatric: He has a normal mood and affect. His behavior is normal.  Nursing note and vitals reviewed.   Assessment & Plan:   1. Impingement syndrome of right shoulder - start nsaids - meloxicam - and the home exercises which were given to him today, call for PT referral if sxs do not improve.  2. Generalized anxiety disorder - Cymbalta and Klonopin refilled  3. Insomnia   4. Screening for prostate cancer   5. Screening for cardiovascular, respiratory, and genitourinary diseases  - needs CPE this yr   He has a little cream and a lot of sugar in his coffee but no food.    Orders Placed This Encounter  Procedures  . CBC  . Lipid panel    Order Specific Question:  Has the patient fasted?    Answer:  Yes  . TSH  . Comprehensive metabolic panel    Order Specific Question:  Has the patient fasted?    Answer:  Yes  . PSA    Meds ordered this encounter  Medications  . clonazePAM (KLONOPIN) 1 MG tablet    Sig: Take 0.5 tablets (0.5 mg total) by mouth 3 times/day as needed-between meals & bedtime for anxiety.    Dispense:  30 tablet    Refill:  1  . DULoxetine (CYMBALTA) 60 MG capsule    Sig: Take 1 capsule (60 mg total) by mouth daily.    Dispense:  90 capsule    Refill:  3    Ok to  change to 30d supply with 11 refills if pt/insurance prefers  . meloxicam (MOBIC) 15 MG tablet    Sig: Take 1 tablet (15 mg total) by mouth daily as needed for pain. Do not use with any other otc pain medication other than tylenol/acetaminophen - so no aleve, ibuprofen, motrin, advil, etc.    Dispense:  30 tablet    Refill:  1    I personally performed the services described in this documentation, which was scribed in my presence. The recorded information has been reviewed and considered, and addended by me as needed.  Delman Cheadle, MD  [facility]

## 2014-06-28 NOTE — Patient Instructions (Signed)

## 2014-06-29 LAB — PSA: PSA: 0.46 ng/mL (ref <=4.00)

## 2014-07-06 ENCOUNTER — Ambulatory Visit (INDEPENDENT_AMBULATORY_CARE_PROVIDER_SITE_OTHER): Payer: BLUE CROSS/BLUE SHIELD | Admitting: Family Medicine

## 2014-07-06 VITALS — BP 118/80 | HR 81 | Temp 98.4°F | Resp 16 | Ht 74.0 in | Wt 222.0 lb

## 2014-07-06 DIAGNOSIS — J4520 Mild intermittent asthma, uncomplicated: Secondary | ICD-10-CM | POA: Diagnosis not present

## 2014-07-06 DIAGNOSIS — R062 Wheezing: Secondary | ICD-10-CM | POA: Diagnosis not present

## 2014-07-06 MED ORDER — IPRATROPIUM BROMIDE 0.02 % IN SOLN
0.5000 mg | Freq: Once | RESPIRATORY_TRACT | Status: AC
  Administered 2014-07-06: 0.5 mg via RESPIRATORY_TRACT

## 2014-07-06 MED ORDER — ALBUTEROL SULFATE (2.5 MG/3ML) 0.083% IN NEBU
2.5000 mg | INHALATION_SOLUTION | Freq: Once | RESPIRATORY_TRACT | Status: AC
  Administered 2014-07-06: 2.5 mg via RESPIRATORY_TRACT

## 2014-07-06 MED ORDER — ALBUTEROL SULFATE HFA 108 (90 BASE) MCG/ACT IN AERS: 2.0000 | INHALATION_SPRAY | Freq: Four times a day (QID) | RESPIRATORY_TRACT | Status: DC | PRN

## 2014-07-06 MED ORDER — BECLOMETHASONE DIPROPIONATE 80 MCG/ACT IN AERS: 1.0000 | INHALATION_SPRAY | Freq: Two times a day (BID) | RESPIRATORY_TRACT | Status: DC

## 2014-07-06 MED ORDER — PREDNISONE 20 MG PO TABS: ORAL_TABLET | ORAL | Status: DC

## 2014-07-06 NOTE — Progress Notes (Signed)
[facility] [address] [place] [address] [phone]  Date:  07/06/2014   Name:  Brian Morrison   DOB:  09/26/1965   MRN:  [id number]  PCP:  Kennon Portela, MD    Chief Complaint: Shortness of Breath and Wheezing   History of Present Illness:  Brian Morrison is a 49 y.o. very pleasant male patient who presents with the following:  He notes that over the last couple of months he has had some issues with wheezing. Worse for the last 4-5 days. It notes it a lot at night, but also had a hard time getting ready in the morning due to SOB He had a little albuterol left over at home; this does seem to help but he will get heartburn, etc from using it   In past years he has had similar but less severe/ long lasting sx.   He never had a fever, but has had some chills.   He has some cough.  He does not seem to be worse with exercise or temperature change.  No new allergens at home. 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    He has pets that sleep in his room but they have done so for years. He is generally in good health.  He last used his albuterol last night.    He did have asthma as a child.  He is a former smoker.  Quit over 15 years ago  No history of glaucoma or significant BPH  Patient Active Problem List   Diagnosis Date Noted  . Depression with anxiety 12/04/2011    Past Medical History  Diagnosis Date  . Anxiety   . Depression   . Allergic rhinitis   . Asthma     History reviewed. No pertinent past surgical history.  History  Substance Use Topics  . Smoking status: Former Smoker    Start date: 12/03/1997  . Smokeless tobacco: Never Used  . Alcohol Use: Yes    Family History  Problem Relation Age of Onset  . COPD Mother     No Known Allergies  Medication list has been reviewed and updated.  Current Outpatient Prescriptions on File Prior to Visit  Medication Sig Dispense Refill  .  DULoxetine (CYMBALTA) 60 MG capsule Take 1 capsule (60 mg total) by mouth daily. 90 capsule 3  . fluticasone (FLONASE) 50 MCG/ACT nasal spray Place 2 sprays into both nostrils daily. 16 g 12  . meloxicam (MOBIC) 15 MG tablet Take 1 tablet (15 mg total) by mouth daily as needed for pain. Do not use with any other otc pain medication other than tylenol/acetaminophen - so no aleve, ibuprofen, motrin, advil, etc. 30 tablet 1  . clonazePAM (KLONOPIN) 1 MG tablet Take 0.5 tablets (0.5 mg total) by mouth 3 times/day as needed-between meals & bedtime for anxiety. (Patient not taking: Reported on 07/06/2014) 30 tablet 1   No current facility-administered medications on file prior to visit.    Review of Systems:  As per HPI- otherwise negative.   Physical Examination: Filed Vitals:   07/06/14 0902  BP: 118/80  Pulse: 81  Temp: 98.4 F (36.9 C)  Resp: 16   Filed Vitals:   07/06/14 0902  Height: 6\' 2"  (1.88 m)  Weight: 222 lb (100.699 kg)   Body mass index is 28.49 kg/(m^2). Ideal Body Weight: Weight in (lb) to have BMI = 25: 194.3  GEN: WDWN, NAD, Non-toxic, A & O x 3 HEENT: Atraumatic, Normocephalic. Neck supple. No masses, No LAD. Ears and Nose: No external deformity. CV: RRR, No M/G/R. No JVD. No thrill. No extra heart sounds. PULM: CTA B, no wheezes, crackles, rhonchi. No retractions. No resp. distress. No accessory muscle use. ABD: S, NT, ND, +BS. No rebound. No HSM. EXTR: No c/c/e NEURO Normal gait.  PSYCH: Normally interactive. Conversant. Not depressed or anxious appearing.  Calm demeanor.  Mild wheezing in bilateral bases  Duoneb: cleared wheezes and improved sat to 98%  Assessment and Plan: Reactive airway disease, mild intermittent, uncomplicated - Plan: predniSONE (DELTASONE) 20 MG tablet, beclomethasone (QVAR) 80 MCG/ACT inhaler  Wheezing - Plan: albuterol (PROVENTIL) (2.5 MG/3ML) 0.083% nebulizer solution 2.5 mg, ipratropium (ATROVENT) nebulizer solution 0.5 mg,  albuterol (PROVENTIL HFA;VENTOLIN HFA) 108 (90 BASE) MCG/ACT inhaler  Went over his labs from last week as well- advised that his CHL is a bit high, OW labs look good.  He is going to work on diet and exercise.   Will start qvar 80 BID, and use prednisone for 3 days only He will use the qvar for a couple of months and then see if he can DC.   See patient instructions for more details.   He will let me know if not better in the next couple of days-Sooner if worse.    avoid mobic while on prednisone   Signed Lamar Blinks, MD

## 2014-07-06 NOTE — Patient Instructions (Signed)
We are going to treat you for reactive airway disease- this means intermittent asthma like symptoms in a person without a diagnosis of asthma  Use the albuterol as needed for wheezing and tightness Add the qvar 1 puff twice a day regardless of symptoms/ wheezing.  If you are doing well in a couple of months try stopping this medication Take prednisone 40 mg for 3 days to speed your recovery. You might also try adding a daily claritin or zyrtec and removing pets from your bedroom   Let me know if you are not improved in 48 hours

## 2014-08-26 ENCOUNTER — Other Ambulatory Visit: Payer: Self-pay | Admitting: Family Medicine

## 2014-11-28 ENCOUNTER — Other Ambulatory Visit: Payer: Self-pay | Admitting: Physician Assistant

## 2014-12-06 ENCOUNTER — Other Ambulatory Visit: Payer: Self-pay | Admitting: Family Medicine

## 2014-12-07 ENCOUNTER — Ambulatory Visit (INDEPENDENT_AMBULATORY_CARE_PROVIDER_SITE_OTHER): Payer: BLUE CROSS/BLUE SHIELD | Admitting: Family Medicine

## 2014-12-07 VITALS — BP 122/80 | HR 84 | Temp 98.4°F | Resp 18 | Ht 74.0 in | Wt 221.0 lb

## 2014-12-07 DIAGNOSIS — B370 Candidal stomatitis: Secondary | ICD-10-CM | POA: Diagnosis not present

## 2014-12-07 DIAGNOSIS — R0683 Snoring: Secondary | ICD-10-CM | POA: Diagnosis not present

## 2014-12-07 DIAGNOSIS — R062 Wheezing: Secondary | ICD-10-CM | POA: Diagnosis not present

## 2014-12-07 MED ORDER — ALBUTEROL SULFATE HFA 108 (90 BASE) MCG/ACT IN AERS: 2.0000 | INHALATION_SPRAY | Freq: Four times a day (QID) | RESPIRATORY_TRACT | Status: DC | PRN

## 2014-12-07 MED ORDER — FLUCONAZOLE 150 MG PO TABS: 150.0000 mg | ORAL_TABLET | Freq: Once | ORAL | Status: DC

## 2014-12-07 MED ORDER — PREDNISONE 20 MG PO TABS: ORAL_TABLET | ORAL | Status: DC

## 2014-12-07 NOTE — Progress Notes (Signed)
Subjective:  This chart was scribed for  Robyn Haber MD, by Tamsen Roers, at [facility].  This patient was seen in room 8 and the patient's care was started at 9:43 AM.    Patient ID: Brian Morrison, male    DOB: 1965/04/23, 49 y.o.   MRN: [id number] Chief Complaint  Patient presents with  . Wheezing    at night x4-5 days worse last night   . Flu Vaccine    HPI  HPI Comments: Brian Morrison is a 49 y.o. male who presents to the [facility] complaining of wheezing onset 4-5 days ago and states that it is worse at night time. His wheezing episodes occur 2-3 times every year.  Patient has been using his QVAR inhaler which ran out a couple weeks ago.  He states that the inhaler gave him "unfortunate" side effects and notes of intermittent hiccups with nausea when he eats. He is unsure if it is due to the QVAR or the Meloxicam (which he discontinued after the pain in his shoulder subsided.)  He has called his pharmacy for albuterol but is still waiting to hear back from them.  Patient notes that his wife has also noticed his breathing is different while he is sleeping but he is unsure if it is due to the wheezing.  He would like to be referred for a sleep study today. Patient is in sales.    Patient Active Problem List   Diagnosis Date Noted  . Depression with anxiety 12/04/2011   Past Medical History  Diagnosis Date  . Anxiety   . Depression   . Allergic rhinitis   . Asthma    History reviewed. No pertinent past surgical history. No Known Allergies Prior to Admission medications   Medication Sig Start Date End Date Taking? Authorizing Provider  albuterol (PROVENTIL HFA;VENTOLIN HFA) 108 (90 BASE) MCG/ACT inhaler Inhale 2 puffs into the lungs every 6 (six) hours as needed for wheezing or shortness of breath. 07/06/14  Yes Gay Filler Copland, MD  DULoxetine (CYMBALTA) 60 MG capsule Take 1 capsule (60 mg total) by mouth daily. 06/28/14   Yes Shawnee Knapp, MD  fluticasone (FLONASE) 50 MCG/ACT nasal spray USE 2 SPRAYS INTO BOTH NOSTRILS EVERY DAY 11/29/14  Yes Gay Filler Copland, MD  beclomethasone (QVAR) 80 MCG/ACT inhaler Inhale 1 puff into the lungs 2 (two) times daily. Patient not taking: Reported on 12/07/2014 07/06/14   Darreld Mclean, MD  clonazePAM (KLONOPIN) 1 MG tablet Take 0.5 tablets (0.5 mg total) by mouth 3 times/day as needed-between meals & bedtime for anxiety. Patient not taking: Reported on 07/06/2014 06/28/14   Shawnee Knapp, MD  meloxicam (MOBIC) 15 MG tablet TAKE 1 TABLET BY MOUTH EVERY DAY AS NEEDED FOR PAIN. DO NOT TAKE WITH OTHER OTC PAIN MEDICATION Patient not taking: Reported on 12/07/2014 08/26/14   Shawnee Knapp, MD  predniSONE (DELTASONE) 20 MG tablet Take 2 pills a day for 3 days Patient not taking: Reported on 12/07/2014 07/06/14   Darreld Mclean, MD   Social History   Social History  . Marital Status: Married    Spouse Name: N/A  . Number of Children: N/A  . Years of Education: N/A   Occupational History  . Not on file.   Social History Main Topics  . Smoking status: Former Smoker    Start date: 12/03/1997  . Smokeless tobacco: Never Used  . Alcohol Use: Yes  . Drug  Use: No  . Sexual Activity: Not on file   Other Topics Concern  . Not on file   Social History Narrative       Review of Systems  Constitutional: Negative for fever and chills.  Eyes: Negative for pain and redness.  Respiratory: Positive for wheezing. Negative for cough and choking.   Gastrointestinal: Positive for nausea. Negative for vomiting.  Musculoskeletal: Negative for neck pain and neck stiffness.  Neurological: Negative for syncope and speech difficulty.       Objective:   Physical Exam  HENT:  Throat is red   Pulmonary/Chest:  Few expiratory wheezes.   Heart regular, no murmur Ext:  No rash or edema Skin:  Old acne scars on back. Neck:  Supple, no adenopathy.  Filed Vitals:   12/07/14 0915  BP: 122/80    Pulse: 84  Temp: 98.4 F (36.9 C)  TempSrc: Oral  Resp: 18  Height: 6\' 2"  (1.88 m)  Weight: 221 lb (100.245 kg)  SpO2: 98%      Assessment & Plan:   This chart was scribed in my presence and reviewed by me personally.    ICD-9-CM ICD-10-CM   1. Wheezing 786.07 R06.2 albuterol (PROVENTIL HFA;VENTOLIN HFA) 108 (90 BASE) MCG/ACT inhaler     predniSONE (DELTASONE) 20 MG tablet  2. Snoring 786.09 R06.83 Ambulatory referral to Sleep Studies  3. Thrush 112.0 B37.0 fluconazole (DIFLUCAN) 150 MG tablet     Signed, Robyn Haber, MD

## 2014-12-07 NOTE — Patient Instructions (Signed)
Thrush, Adult Brian Morrison, also called oral candidiasis, is a fungal infection that develops in the mouth and throat and on the tongue. It causes white patches to form on the mouth and tongue. Brian Morrison is most common in older adults, but it can occur at any age.  Many cases of thrush are mild, but this infection can also be more serious. Brian Morrison can be a recurring problem for people who have chronic illnesses or who take medicines that limit the body's ability to fight infection. Because these people have difficulty fighting infections, the fungus that causes thrush can spread throughout the body. This can cause life-threatening blood or organ infections. CAUSES  Brian Morrison is usually caused by a yeast called Candida albicans. This fungus is normally present in small amounts in the mouth and on other mucous membranes. It usually causes no harm. However, when conditions are present that allow the fungus to grow uncontrolled, it invades surrounding tissues and becomes an infection. Less often, other Candida species can also lead to thrush.  RISK FACTORS Brian Morrison is more likely to develop in the following people:  People with an impaired ability to fight infection (weakened immune system).   Older adults.   People with HIV.   People with diabetes.   People with dry mouth (xerostomia).   Pregnant women.   People with poor dental care, especially those who have false teeth.   People who use antibiotic medicines.  SIGNS AND SYMPTOMS  Brian Morrison can be a mild infection that causes no symptoms. If symptoms develop, they may include:   A burning feeling in the mouth and throat. This can occur at the start of a thrush infection.   White patches that adhere to the mouth and tongue. The tissue around the patches may be red, raw, and painful. If rubbed (during tooth brushing, for example), the patches and the tissue of the mouth may bleed easily.   A bad taste in the mouth or difficulty tasting foods.    Cottony feeling in the mouth.   Pain during eating and swallowing. DIAGNOSIS  Your health care provider can usually diagnose thrush by looking in your mouth and asking you questions about your health.  TREATMENT  Medicines that help prevent the growth of fungi (antifungals) are the standard treatment for thrush. These medicines are either applied directly to the affected area (topical) or swallowed (oral). The treatment will depend on the severity of the condition.  Mild Thrush Mild cases of thrush may clear up with the use of an antifungal mouth rinse or lozenges. Treatment usually lasts about 14 days.  Moderate to Severe Thrush  More severe thrush infections that have spread to the esophagus are treated with an oral antifungal medicine. A topical antifungal medicine may also be used.   For some severe infections, a treatment period longer than 14 days may be needed.   Oral antifungal medicines are almost never used during pregnancy because the fetus may be harmed. However, if a pregnant woman has a rare, severe thrush infection that has spread to her blood, oral antifungal medicines may be used. In this case, the risk of harm to the mother and fetus from the severe thrush infection may be greater than the risk posed by the use of antifungal medicines.  Persistent or Recurrent Thrush For cases of thrush that do not go away or keep coming back, treatment may involve the following:   Treatment may be needed twice as long as the symptoms last.   Treatment will include  both oral and topical antifungal medicines.   People with weakened immune systems can take an antifungal medicine on a continuous basis to prevent thrush infections.  It is important to treat conditions that make you more likely to get thrush, such as diabetes or HIV.  HOME CARE INSTRUCTIONS   Only take over-the-counter or prescription medicine as directed by your health care provider. Talk to your health care  provider about an over-the-counter medicine called gentian violet, which kills bacteria and fungi.   Eat plain, unflavored yogurt as directed by your health care provider. Check the label to make sure the yogurt contains live cultures. This yogurt can help healthy bacteria grow in the mouth that can stop the growth of the fungus that causes thrush.   Try these measures to help reduce the discomfort of thrush:   Drink cold liquids such as water or iced tea.   Try flavored ice treats or frozen juices.   Eat foods that are easy to swallow, such as gelatin, ice cream, or custard.   If the patches in your mouth are painful, try drinking from a straw.   Rinse your mouth several times a day with a warm saltwater rinse. You can make the saltwater mixture with 1 tsp (6 g) of salt in 8 fl oz (0.2 L) of warm water.   If you wear dentures, remove the dentures before going to bed, brush them vigorously, and soak them in a cleaning solution as directed by your health care provider.   Women who are breastfeeding should clean their nipples with an antifungal medicine as directed by their health care provider. Dry the nipples after breastfeeding. Applying lanolin-containing body lotion may help relieve nipple soreness.  SEEK MEDICAL CARE IF:  Your symptoms are getting worse or are not improving within 7 days of starting treatment.   You have symptoms of spreading infection, such as white patches on the skin outside of the mouth.   You are nursing and you have redness, burning, or pain in the nipples that is not relieved with treatment.  MAKE SURE YOU:  Understand these instructions.  Will watch your condition.  Will get help right away if you are not doing well or get worse.   This information is not intended to replace advice given to you by your health care provider. Make sure you discuss any questions you have with your health care provider.   Document Released: 11/14/2003 Document  Revised: 03/11/2014 Document Reviewed: 09/21/2012 Elsevier Interactive Patient Education 2016 [place] Prevention While you may not be able to control the fact that you have asthma, you can take actions to prevent asthma attacks. The best way to prevent asthma attacks is to maintain good control of your asthma. You can achieve this by:  Taking your medicines as directed.  Avoiding things that can irritate your airways or make your asthma symptoms worse (asthma triggers).  Keeping track of how well your asthma is controlled and of any changes in your symptoms.  Responding quickly to worsening asthma symptoms (asthma attack).  Seeking emergency care when it is needed. WHAT ARE SOME WAYS TO PREVENT AN ASTHMA ATTACK? Have a Plan Work with your health care provider to create a written plan for managing and treating your asthma attacks (asthma action plan). This plan includes:  A list of your asthma triggers and how you can avoid them.  Information on when medicines should be taken and when their dosages should be changed.  The use  of a device that measures how well your lungs are working (peak flow meter). Monitor Your Asthma Use your peak flow meter and record your results in a journal every day. A drop in your peak flow numbers on one or more days may indicate the start of an asthma attack. This can happen even before you start to feel symptoms. You can prevent an asthma attack from getting worse by following the steps in your asthma action plan. Avoid Asthma Triggers Work with your asthma health care provider to find out what your asthma triggers are. This can be done by:  Allergy testing.  Keeping a journal that notes when asthma attacks occur and the factors that may have contributed to them.  Determining if there are other medical conditions that are making your asthma worse. Once you have determined your asthma triggers, take steps to avoid them. This may  include avoiding excessive or prolonged exposure to:  Dust. Have someone dust and vacuum your home for you once or twice a week. Using a high-efficiency particulate arrestance (HEPA) vacuum is best.  Smoke. This includes campfire smoke, forest fire smoke, and secondhand smoke from tobacco products.  Pet dander. Avoid contact with animals that you know you are allergic to.  Allergens from trees, grasses or pollens. Avoid spending a lot of time outdoors when pollen counts are high, and on very windy days.  Very cold, dry, or humid air.  Mold.  Foods that contain high amounts of sulfites.  Strong odors.  Outdoor air pollutants, such as Lexicographer.  Indoor air pollutants, such as aerosol sprays and fumes from household cleaners.  Household pests, including dust mites and cockroaches, and pest droppings.  Certain medicines, including NSAIDs. Always talk to your health care provider before stopping or starting any new medicines. Medicines Take over-the-counter and prescription medicines only as told by your health care provider. Many asthma attacks can be prevented by carefully following your medicine schedule. Taking your medicines correctly is especially important when you cannot avoid certain asthma triggers. Act Quickly If an asthma attack does happen, acting quickly can decrease how severe it is and how long it lasts. Take these steps:   Pay attention to your symptoms. If you are coughing, wheezing, or having difficulty breathing, do not wait to see if your symptoms go away on their own. Follow your asthma action plan.  If you have followed your asthma action plan and your symptoms are not improving, call your health care provider or seek immediate medical care at the nearest hospital. It is important to note how often you need to use your fast-acting rescue inhaler. If you are using your rescue inhaler more often, it may mean that your asthma is not under control. Adjusting your  asthma treatment plan may help you to prevent future asthma attacks and help you to gain better control of your condition. HOW CAN I PREVENT AN ASTHMA ATTACK WHEN I EXERCISE? Follow advice from your health care provider about whether you should use your fast-acting inhaler before exercising. Many people with asthma experience exercise-induced bronchoconstriction (EIB). This condition often worsens during vigorous exercise in cold, humid, or dry environments. Usually, people with EIB can stay very active by pre-treating with a fast-acting inhaler before exercising.   This information is not intended to replace advice given to you by your health care provider. Make sure you discuss any questions you have with your health care provider.   Document Released: 02/06/2009 Document Revised: 11/09/2014 Document Reviewed: 07/21/2014  Chartered certified accountant Patient Education [employer].

## 2014-12-29 ENCOUNTER — Ambulatory Visit (INDEPENDENT_AMBULATORY_CARE_PROVIDER_SITE_OTHER): Payer: BLUE CROSS/BLUE SHIELD | Admitting: Neurology

## 2014-12-29 ENCOUNTER — Encounter: Payer: Self-pay | Admitting: Neurology

## 2014-12-29 VITALS — BP 116/78 | HR 72 | Resp 16 | Ht 74.0 in | Wt 221.0 lb

## 2014-12-29 DIAGNOSIS — G478 Other sleep disorders: Secondary | ICD-10-CM

## 2014-12-29 DIAGNOSIS — K219 Gastro-esophageal reflux disease without esophagitis: Secondary | ICD-10-CM | POA: Diagnosis not present

## 2014-12-29 DIAGNOSIS — R351 Nocturia: Secondary | ICD-10-CM | POA: Diagnosis not present

## 2014-12-29 DIAGNOSIS — E663 Overweight: Secondary | ICD-10-CM | POA: Diagnosis not present

## 2014-12-29 DIAGNOSIS — R0681 Apnea, not elsewhere classified: Secondary | ICD-10-CM | POA: Diagnosis not present

## 2014-12-29 NOTE — Progress Notes (Signed)
Subjective:    Patient ID: Brian Morrison is a 49 y.o. male.  HPI     Star Age, MD, PhD [facility] [address], [address] [place], [place] [address]  Dear Dr. Joseph Art,   I saw your patient, Brian Morrison, upon your kind request in my neurologic clinic today for initial consultation of his sleep disorder, in particular, concern for underlying obstructive sleep apnea. The patient is unaccompanied today. As you know, Mr. Brian Morrison is a 49 year old right-handed gentleman with an underlying medical history of anxiety, depression, allergic rhinitis, asthma and overweight state, who reports snoring and witnessed apneas while asleep. I reviewed your office note from 12/07/2014. He reports daytime tiredness but is not frankly sleepy during the day. He has some trouble falling asleep at night. He took clonazepam at night for sleep but because it is habit forming he faced out of it. He has not taken it in over a year. He goes to bed between 10:30 and 11 and sometimes he is awake for over an hour before finally going to sleep. Rise time is between 6:30 and 6:45 AM and he does not wake up rested. His Epworth sleepiness score is 1 today, his fatigue score is 43 out of 63. He does not report morning headaches or restless legs type symptoms. He does not report a family history of obstructive sleep apnea. He reports nonrestorative sleep and witnessed apneas for years. Weight has been stable. He has nighttime reflux symptoms for which he takes Pepcid over-the-counter. He has allergy symptoms and nighttime nasal congestion and drainage. He works in [employer]. He quit smoking in 1999. He quit drinking alcohol in 2014. He drinks 1 large coffee in the morning and has reduced or eliminated his soda intake. He lives at home with his family which includes his wife and 4 children, ages 22, 23, 60 and 43. Sometimes his 23-year-old daughter sleeps in their bed. He has nocturia, usually 2-3  times per night.  His Past Medical History Is Significant For: Past Medical History  Diagnosis Date  . Anxiety   . Depression   . Allergic rhinitis   . Asthma     His Past Surgical History Is Significant For: No past surgical history on file.  His Family History Is Significant For: Family History  Problem Relation Age of Onset  . COPD Mother   . Lung cancer Maternal Grandmother   . Alzheimer's disease Maternal Grandfather   . Heart disease Paternal Grandfather     His Social History Is Significant For: Social History   Social History  . Marital Status: Married    Spouse Name: N/A  . Number of Children: 4  . Years of Education: [employer]   Social History Main Topics  . Smoking status: Former Smoker    Start date: 12/03/1997  . Smokeless tobacco: Never Used  . Alcohol Use: No  . Drug Use: No  . Sexual Activity: Not Asked   Other Topics Concern  . None   Social History Narrative   Drinks about 1 large cup of coffee a day     His Allergies Are:  No Known Allergies:   His Current Medications Are:  Outpatient Encounter Prescriptions as of 12/29/2014  Medication Sig  . albuterol (PROVENTIL HFA;VENTOLIN HFA) 108 (90 BASE) MCG/ACT inhaler Inhale 2 puffs into the lungs every 6 (six) hours as needed for wheezing or shortness of breath.  . DULoxetine (CYMBALTA) 60 MG capsule Take 1 capsule (60 mg total) by  mouth daily.  . fluticasone (FLONASE) 50 MCG/ACT nasal spray USE 2 SPRAYS INTO BOTH NOSTRILS EVERY DAY  . [DISCONTINUED] clonazePAM (KLONOPIN) 1 MG tablet Take 0.5 tablets (0.5 mg total) by mouth 3 times/day as needed-between meals & bedtime for anxiety. (Patient not taking: Reported on 07/06/2014)  . [DISCONTINUED] fluconazole (DIFLUCAN) 150 MG tablet Take 1 tablet (150 mg total) by mouth once.  . [DISCONTINUED] predniSONE (DELTASONE) 20 MG tablet Two daily with food   No facility-administered encounter medications on file as of 12/29/2014.  :  Review of Systems:   Out of a complete 14 point review of systems, all are reviewed and negative with the exception of these symptoms as listed below:  Review of Systems  Respiratory: Positive for wheezing.   Allergic/Immunologic: Positive for environmental allergies.  Neurological:       Trouble falling and staying asleep, snoring, witnessed apnea, wakes up feeling tired in the morning, daytime tiredness, denies taking naps.   Psychiatric/Behavioral:       Anxiety, decreased energy    Epworth Sleepiness Scale 0= would never doze 1= slight chance of dozing 2= moderate chance of dozing 3= high chance of dozing  Sitting and reading:0 Watching TV:1 Sitting inactive in a public place (ex. Theater or meeting):0 As a passenger in a car for an hour without a break:0 Lying down to rest in the afternoon:0 Sitting and talking to someone:0 Sitting quietly after lunch (no alcohol):0 In a car, while stopped in traffic:0 Total:1 Objective:  Neurologic Exam  Physical Exam Physical Examination:   Filed Vitals:   12/29/14 0900  BP: 116/78  Pulse: 72  Resp: 16    General Examination: The patient is a very pleasant 49 y.o. male in no acute distress. He appears well-developed and well-nourished and well groomed.   HEENT: Normocephalic, atraumatic, pupils are equal, round and reactive to light and accommodation. Funduscopic exam is normal with sharp disc margins noted. Extraocular tracking is good without limitation to gaze excursion or nystagmus noted. Normal smooth pursuit is noted. Hearing is grossly intact. Tympanic membranes are clear bilaterally. Face is symmetric with normal facial animation and normal facial sensation. Speech is clear with no dysarthria noted. There is no hypophonia. There is no lip, neck/head, jaw or voice tremor. Neck is supple with full range of passive and active motion. There are no carotid bruits on auscultation. Oropharynx exam reveals: mild mouth dryness, adequate dental hygiene and  moderate airway crowding, due to larger uvula, which appears slightly swollen and irritated, tonsils of 2+ bilaterally, right side a little bit more prominent than left, and wider tongue. Mallampati is class II. Tongue protrudes centrally and palate elevates symmetrically. Neck size is 18.25 inches. He has a Mild overbite. Nasal inspection reveals no significant nasal mucosal bogginess or redness and no septal deviation.   Chest: Clear to auscultation without wheezing, rhonchi or crackles noted.  Heart: S1+S2+0, regular and normal without murmurs, rubs or gallops noted.   Abdomen: Soft, non-tender and non-distended with normal bowel sounds appreciated on auscultation.  Extremities: There is no pitting edema in the distal lower extremities bilaterally. Pedal pulses are intact.  Skin: Warm and dry without trophic changes noted. There are no varicose veins.  Musculoskeletal: exam reveals no obvious joint deformities, tenderness or joint swelling or erythema.   Neurologically:  Mental status: The patient is awake, alert and oriented in all 4 spheres. His immediate and remote memory, attention, language skills and fund of knowledge are appropriate. There is no evidence of  aphasia, agnosia, apraxia or anomia. Speech is clear with normal prosody and enunciation. Thought process is linear. Mood is normal and affect is normal.  Cranial nerves II - XII are as described above under HEENT exam. In addition: shoulder shrug is normal with equal shoulder height noted. Motor exam: Normal bulk, strength and tone is noted. There is no drift, tremor or rebound. Romberg is negative. Reflexes are 2+ throughout. Babinski: Toes are flexor bilaterally. Fine motor skills and coordination: intact with normal finger taps, normal hand movements, normal rapid alternating patting, normal foot taps and normal foot agility.  Cerebellar testing: No dysmetria or intention tremor on finger to nose testing. Heel to shin is  unremarkable bilaterally. There is no truncal or gait ataxia.  Sensory exam: intact to light touch, pinprick, vibration, temperature sense in the upper and lower extremities.  Gait, station and balance: He stands easily. No veering to one side is noted. No leaning to one side is noted. Posture is age-appropriate and stance is narrow based. Gait shows normal stride length and normal pace. No problems turning are noted. He turns en bloc. Tandem walk is unremarkable.  Assessment and Plan:   In summary, Johnhenry Tippin is a very pleasant 49 y.o.-year old male with an underlying medical history of anxiety, depression, allergic rhinitis, asthma and overweight state, who reports snoring and witnessed apneas while asleep, as well as nonrestorative sleep and nocturia. His history and physical exam are indeed concerning for obstructive sleep apnea (OSA). I had a long chat with the patient about my findings and the diagnosis of OSA, its prognosis and treatment options. We talked about medical treatments, surgical interventions and non-pharmacological approaches. I explained in particular the risks and ramifications of untreated moderate to severe OSA, especially with respect to developing cardiovascular disease down the Road, including congestive heart failure, difficult to treat hypertension, cardiac arrhythmias, or stroke. Even type 2 diabetes has, in part, been linked to untreated OSA. Symptoms of untreated OSA include daytime sleepiness, memory problems, mood irritability and mood disorder such as depression and anxiety, lack of energy, as well as recurrent headaches, especially morning headaches. We talked about trying to maintain a healthy lifestyle in general, as well as the importance of weight control. I encouraged the patient to eat healthy, exercise daily and keep well hydrated, to keep a scheduled bedtime and wake time routine, to not skip any meals and eat healthy snacks in between meals. I advised the  patient not to drive when feeling sleepy. I recommended the following at this time: sleep study with potential positive airway pressure titration. (We will score hypopneas at 3% and split the sleep study into diagnostic and treatment portion, if the estimated. 2 hour AHI is >15/h).   I explained the sleep test procedure to the patient and also outlined possible surgical and non-surgical treatment options of OSA, including the use of a custom-made dental device (which would require a referral to a specialist dentist or oral surgeon), upper airway surgical options, such as pillar implants, radiofrequency surgery, tongue base surgery, and UPPP (which would involve a referral to an ENT surgeon). Rarely, jaw surgery such as mandibular advancement may be considered.  I also explained the CPAP treatment option to the patient, who indicated that he would be willing to try CPAP if the need arises. I explained the importance of being compliant with PAP treatment, not only for insurance purposes but primarily to improve His symptoms, and for the patient's long term health benefit, including to reduce  His cardiovascular risks. I answered all his questions today and the patient was in agreement. I would like to see him back after the sleep study is completed and encouraged him to call with any interim questions, concerns, problems or updates.   Thank you very much for allowing me to participate in the care of this nice patient. If I can be of any further assistance to you please do not hesitate to call me at [phone].  Sincerely,   Star Age, MD, PhD

## 2014-12-29 NOTE — Patient Instructions (Signed)

## 2015-06-16 ENCOUNTER — Other Ambulatory Visit: Payer: Self-pay | Admitting: Family Medicine

## 2015-07-02 ENCOUNTER — Other Ambulatory Visit: Payer: Self-pay | Admitting: Family Medicine

## 2015-07-17 ENCOUNTER — Other Ambulatory Visit: Payer: Self-pay | Admitting: Family Medicine

## 2015-07-18 ENCOUNTER — Ambulatory Visit (INDEPENDENT_AMBULATORY_CARE_PROVIDER_SITE_OTHER): Payer: BLUE CROSS/BLUE SHIELD | Admitting: Emergency Medicine

## 2015-07-18 VITALS — BP 126/82 | HR 95 | Temp 98.5°F | Resp 16 | Ht 73.75 in | Wt 220.4 lb

## 2015-07-18 DIAGNOSIS — Z13220 Encounter for screening for lipoid disorders: Secondary | ICD-10-CM | POA: Diagnosis not present

## 2015-07-18 DIAGNOSIS — J4521 Mild intermittent asthma with (acute) exacerbation: Secondary | ICD-10-CM | POA: Diagnosis not present

## 2015-07-18 DIAGNOSIS — J309 Allergic rhinitis, unspecified: Secondary | ICD-10-CM | POA: Insufficient documentation

## 2015-07-18 DIAGNOSIS — J301 Allergic rhinitis due to pollen: Secondary | ICD-10-CM | POA: Diagnosis not present

## 2015-07-18 DIAGNOSIS — R062 Wheezing: Secondary | ICD-10-CM

## 2015-07-18 DIAGNOSIS — J4520 Mild intermittent asthma, uncomplicated: Secondary | ICD-10-CM | POA: Diagnosis not present

## 2015-07-18 DIAGNOSIS — F411 Generalized anxiety disorder: Secondary | ICD-10-CM | POA: Diagnosis not present

## 2015-07-18 DIAGNOSIS — J45901 Unspecified asthma with (acute) exacerbation: Secondary | ICD-10-CM | POA: Insufficient documentation

## 2015-07-18 LAB — POCT CBC
Granulocyte percent: 54.8 %G (ref 37–80)
HCT, POC: 45.8 % (ref 43.5–53.7)
Hemoglobin: 16.2 g/dL (ref 14.1–18.1)
Lymph, poc: 2.1 (ref 0.6–3.4)
MCH, POC: 30.7 pg (ref 27–31.2)
MCHC: 35.3 g/dL (ref 31.8–35.4)
MCV: 86.9 fL (ref 80–97)
MID (cbc): 0.6 (ref 0–0.9)
MPV: 8.4 fL (ref 0–99.8)
POC Granulocyte: 3.2 (ref 2–6.9)
POC LYMPH PERCENT: 35.4 %L (ref 10–50)
POC MID %: 9.8 %M (ref 0–12)
Platelet Count, POC: 215 10*3/uL (ref 142–424)
RBC: 5.27 M/uL (ref 4.69–6.13)
RDW, POC: 13 %
WBC: 5.9 10*3/uL (ref 4.6–10.2)

## 2015-07-18 MED ORDER — ALBUTEROL SULFATE HFA 108 (90 BASE) MCG/ACT IN AERS: 2.0000 | INHALATION_SPRAY | Freq: Four times a day (QID) | RESPIRATORY_TRACT | Status: DC | PRN

## 2015-07-18 MED ORDER — MONTELUKAST SODIUM 10 MG PO TABS: 10.0000 mg | ORAL_TABLET | Freq: Every day | ORAL | Status: DC

## 2015-07-18 MED ORDER — DULOXETINE HCL 30 MG PO CPEP: ORAL_CAPSULE | ORAL | Status: DC

## 2015-07-18 MED ORDER — FLUTICASONE PROPIONATE 50 MCG/ACT NA SUSP: NASAL | Status: DC

## 2015-07-18 NOTE — Progress Notes (Addendum)
Subjective:  This chart was scribed for Brian Queen MD, by Tamsen Roers, at [facility]  This patient was seen in room 1 and the patient's care was started at 11:49 AM.   Chief Complaint  Patient presents with  . Medication Refill    Duloxetine      Patient ID: Brian Morrison, male    DOB: 1966-01-15, 50 y.o.   MRN: [id number]  HPI HPI Comments: Brian Morrison is a 50 y.o. male who presents to the [facility] for a medication refill: (Duloxetine- 60 mg).    He is wondering what the process would be to start weaning off of Duloxetine. He has been using it for the past 4-5 years for his anxiety.  He states that things are good right now and it would be the right time to get off of it.  He has challenges with family at home but states that it has been going "well". He is willing to go back and forth between a 60 mg and a 30 mg dose to start the process.   He has been wheezing more frequently the past 2-3 months and thinks it may be weather related/his weight.  He has been using his inhaler 1-2 times every other day. Patient has cats and dogs at home.  He also has heart burn more often recently and has been taking medication for it. He is compliant with his Flonase.  He has used a steroid inhaler in the past - last year- but states that it caused him to have a yeast infection.   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 He has not had blood work done in the past year and is willing to have one today.     Patient Active Problem List   Diagnosis Date Noted  . Depression with anxiety 12/04/2011   Past Medical History  Diagnosis Date  . Anxiety   . Depression   . Allergic rhinitis   . Asthma    History reviewed. No pertinent past surgical history. No Known Allergies Prior to Admission medications   Medication Sig Start Date End Date Taking? Authorizing Provider    albuterol (PROVENTIL HFA;VENTOLIN HFA) 108 (90 BASE) MCG/ACT inhaler Inhale 2 puffs into the lungs every 6 (six) hours as needed for wheezing or shortness of breath. 12/07/14  Yes Robyn Haber, MD  DULoxetine (CYMBALTA) 60 MG capsule TAKE ONE CAPSULE BY MOUTH EVERY DAY 06/19/15  Yes Shawnee Knapp, MD  fluticasone (FLONASE) 50 MCG/ACT nasal spray SHAKE LIQUID AND USE 2 SPRAYS IN EACH NOSTRIL EVERY DAY 07/03/15  Yes Darreld Mclean, MD   Social History   Social History  . Marital Status: Married    Spouse Name: N/A  . Number of Children: 4  . Years of Education: College   Occupational History  . Not on file.   Social History Main Topics  . Smoking status: Former Smoker    Start date: 12/03/1997  . Smokeless tobacco: Never Used  . Alcohol Use: No  . Drug Use: No  . Sexual Activity: Not on file   Other Topics Concern  . Not on file   Social History Narrative   Drinks about 1 large cup of coffee a day        Review of Systems  Constitutional: Negative for fever and chills.  Eyes: Negative for photophobia, pain and redness.  Respiratory:  Negative for cough, choking and shortness of breath.   Gastrointestinal: Negative for nausea and vomiting.  Musculoskeletal: Negative for neck pain and neck stiffness.  Neurological: Negative for syncope and speech difficulty.       Objective:   Physical Exam Filed Vitals:   07/18/15 1005  BP: 126/82  Pulse: 95  Temp: 98.5 F (36.9 C)  TempSrc: Oral  Resp: 16  Height: 6' 1.75" (1.873 m)  Weight: 220 lb 6.4 oz (99.973 kg)  SpO2: 98%    CONSTITUTIONAL: Patient is alert and cooperative.  HEAD: Normocephalic/atraumatic EYES: EOMI/PERRL SPINE/BACK:entire spine nontender CV: S1/S2 noted, no murmurs/rubs/gallops noted LUNGS: Lungs are clear to auscultation bilaterally, no apparent distress NEURO: Pt is awake/alert/appropriate, moves all extremitiesx4.  No facial droop.   EXTREMITIES: pulses normal/equal, full ROM SKIN: warm, color  normal PSYCH: no abnormalities of mood noted, alert and oriented to situation Peak flow: 580.  Results for orders placed or performed in visit on 07/18/15  POCT CBC  Result Value Ref Range   WBC 5.9 4.6 - 10.2 K/uL   Lymph, poc 2.1 0.6 - 3.4   POC LYMPH PERCENT 35.4 10 - 50 %L   MID (cbc) 0.6 0 - 0.9   POC MID % 9.8 0 - 12 %M   POC Granulocyte 3.2 2 - 6.9   Granulocyte percent 54.8 37 - 80 %G   RBC 5.27 4.69 - 6.13 M/uL   Hemoglobin 16.2 14.1 - 18.1 g/dL   HCT, POC 45.8 43.5 - 53.7 %   MCV 86.9 80 - 97 fL   MCH, POC 30.7 27 - 31.2 pg   MCHC 35.3 31.8 - 35.4 g/dL   RDW, POC 13.0 %   Platelet Count, POC 215 142 - 424 K/uL   MPV 8.4 0 - 99.8 fL   Meds ordered this encounter  Medications  . fluticasone (FLONASE) 50 MCG/ACT nasal spray    Sig: SHAKE LIQUID AND USE 2 SPRAYS IN EACH NOSTRIL EVERY DAY    Dispense:  16 g    Refill:  11  . albuterol (PROVENTIL HFA;VENTOLIN HFA) 108 (90 Base) MCG/ACT inhaler    Sig: Inhale 2 puffs into the lungs every 6 (six) hours as needed for wheezing or shortness of breath.    Dispense:  1 Inhaler    Refill:  11  . DULoxetine (CYMBALTA) 30 MG capsule    Sig: Take 1-2 capsules daily for anxiety depression.    Dispense:  60 capsule    Refill:  11  . montelukast (SINGULAIR) 10 MG tablet    Sig: Take 1 tablet (10 mg total) by mouth at bedtime.    Dispense:  30 tablet    Refill:  11      Assessment & Plan:  We'll start the weaning process with Cymbalta. He will try and wean by taking 60 mg alternating 30 mg daily. If he has difficulty with return of anxiety symptoms with this he will go to a 60 mg 60 mg 30 mg schedule. I told him when he got down to 30 mg a day to return to clinic and we will discuss. I did give him Singulair to have at night. He will continue on Zantac at night. I did refill his Flonase albuterol and And 30 mg Cymbalta so he can take 1-2 a day. Routine labs were done.I personally performed the services described in this  documentation, which was scribed in my presence. The recorded information has been reviewed and is accurate.  Brian Morrison  Lennart Pall, MD

## 2015-07-18 NOTE — Patient Instructions (Addendum)
You have a prescription for Cymbalta 30 mg and can take 1-2 per day. I have started you on Singulair to use at night. Continue your Zantac at night. Use your albuterol inhaler as needed.    IF you received an x-ray today, you will receive an invoice from [facility]. Please contact [facility] at [phone] with questions or concerns regarding your invoice.   IF you received labwork today, you will receive an invoice from [facility]. Please contact [facility] at [phone] with questions or concerns regarding your invoice.   Our billing staff will not be able to assist you with questions regarding bills from these companies.  You will be contacted with the lab results as soon as they are available. The fastest way to get your results is to activate your My Chart account. Instructions are located on the last page of this paperwork. If you have not heard from [place] regarding the results in 2 weeks, please contact this office.

## 2015-07-19 LAB — COMPLETE METABOLIC PANEL WITH GFR
ALT: 16 U/L (ref 9–46)
AST: 17 U/L (ref 10–40)
Albumin: 4.7 g/dL (ref 3.6–5.1)
Alkaline Phosphatase: 54 U/L (ref 40–115)
BUN: 13 mg/dL (ref 7–25)
CO2: 25 mmol/L (ref 20–31)
Calcium: 9.2 mg/dL (ref 8.6–10.3)
Chloride: 102 mmol/L (ref 98–110)
Creat: 1 mg/dL (ref 0.60–1.35)
GFR, Est African American: >89 mL/min (ref >=60)
GFR, Est Non African American: 89 mL/min (ref >=60)
Glucose, Bld: 93 mg/dL (ref 65–99)
Potassium: 4.4 mmol/L (ref 3.5–5.3)
Sodium: 137 mmol/L (ref 135–146)
Total Bilirubin: 0.9 mg/dL (ref 0.2–1.2)
Total Protein: 7.1 g/dL (ref 6.1–8.1)

## 2015-07-19 LAB — LIPID PANEL
Cholesterol: 201 mg/dL — ABNORMAL HIGH (ref 125–200)
HDL: 43 mg/dL (ref >=40)
LDL Cholesterol: 134 mg/dL — ABNORMAL HIGH (ref <130)
Total CHOL/HDL Ratio: 4.7 Ratio (ref <=5.0)
Triglycerides: 118 mg/dL (ref <150)
VLDL: 24 mg/dL (ref <30)

## 2015-07-19 LAB — TSH: TSH: 1.63 mIU/L (ref 0.40–4.50)

## 2015-08-10 ENCOUNTER — Ambulatory Visit (INDEPENDENT_AMBULATORY_CARE_PROVIDER_SITE_OTHER): Payer: BLUE CROSS/BLUE SHIELD | Admitting: Physician Assistant

## 2015-08-10 ENCOUNTER — Ambulatory Visit (HOSPITAL_BASED_OUTPATIENT_CLINIC_OR_DEPARTMENT_OTHER)
Admission: RE | Admit: 2015-08-10 | Discharge: 2015-08-10 | Disposition: A | Discharge status: Home / Self Care | Payer: BLUE CROSS/BLUE SHIELD | Source: Ambulatory Visit | Attending: Physician Assistant | Admitting: Physician Assistant

## 2015-08-10 VITALS — BP 140/92 | HR 74 | Temp 97.8°F | Resp 16 | Ht 73.75 in | Wt 223.0 lb

## 2015-08-10 DIAGNOSIS — X58XXXA Exposure to other specified factors, initial encounter: Secondary | ICD-10-CM | POA: Diagnosis not present

## 2015-08-10 DIAGNOSIS — R410 Disorientation, unspecified: Secondary | ICD-10-CM | POA: Insufficient documentation

## 2015-08-10 DIAGNOSIS — G44319 Acute post-traumatic headache, not intractable: Secondary | ICD-10-CM

## 2015-08-10 DIAGNOSIS — R51 Headache: Secondary | ICD-10-CM | POA: Diagnosis not present

## 2015-08-10 DIAGNOSIS — S060X0A Concussion without loss of consciousness, initial encounter: Secondary | ICD-10-CM

## 2015-08-10 DIAGNOSIS — S0990XA Unspecified injury of head, initial encounter: Secondary | ICD-10-CM | POA: Diagnosis not present

## 2015-08-10 NOTE — Patient Instructions (Addendum)
You are to go over to [facility] now for your CT scan.  Enter through Emergency Room and let them know that you are there for your CT scan.  Address: [address], [place], [place] [address].  [phone]     IF you received an x-ray today, you will receive an invoice from [facility]. Please contact [facility] at [phone] with questions or concerns regarding your invoice.   IF you received labwork today, you will receive an invoice from [facility]. Please contact [facility] at [phone] with questions or concerns regarding your invoice.   Our billing staff will not be able to assist you with questions regarding bills from these companies.  You will be contacted with the lab results as soon as they are available. The fastest way to get your results is to activate your My Chart account. Instructions are located on the last page of this paperwork. If you have not heard from [place] regarding the results in 2 weeks, please contact this office.
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
## 2015-08-11 MED ORDER — CYCLOBENZAPRINE HCL 10 MG PO TABS: 5.0000 mg | ORAL_TABLET | Freq: Three times a day (TID) | ORAL | Status: DC | PRN

## 2015-08-11 NOTE — Addendum Note (Signed)
Addended by: Tereasa Coop on: 08/11/2015 08:01 AM   Modules accepted: Orders

## 2015-08-17 ENCOUNTER — Ambulatory Visit (INDEPENDENT_AMBULATORY_CARE_PROVIDER_SITE_OTHER): Payer: BLUE CROSS/BLUE SHIELD | Admitting: Physician Assistant

## 2015-08-17 VITALS — BP 112/72 | HR 78 | Temp 98.4°F | Resp 18 | Ht 73.75 in | Wt 224.0 lb

## 2015-08-17 DIAGNOSIS — S060X0D Concussion without loss of consciousness, subsequent encounter: Secondary | ICD-10-CM

## 2015-08-17 NOTE — Patient Instructions (Signed)
     IF you received an x-ray today, you will receive an invoice from [facility]. Please contact  Radiology at [phone] with questions or concerns regarding your invoice.   IF you received labwork today, you will receive an invoice from [facility]. Please contact [facility] at [phone] with questions or concerns regarding your invoice.   Our billing staff will not be able to assist you with questions regarding bills from these companies.  You will be contacted with the lab results as soon as they are available. The fastest way to get your results is to activate your My Chart account. Instructions are located on the last page of this paperwork. If you have not heard from us regarding the results in 2 weeks, please contact this office.      
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
## 2015-08-31 ENCOUNTER — Ambulatory Visit (INDEPENDENT_AMBULATORY_CARE_PROVIDER_SITE_OTHER): Payer: BLUE CROSS/BLUE SHIELD | Admitting: Neurology

## 2015-08-31 ENCOUNTER — Encounter: Payer: Self-pay | Admitting: Neurology

## 2015-08-31 VITALS — BP 135/90 | HR 73 | Resp 18 | Ht 73.75 in | Wt 222.0 lb

## 2015-08-31 DIAGNOSIS — S060X0D Concussion without loss of consciousness, subsequent encounter: Secondary | ICD-10-CM

## 2015-08-31 DIAGNOSIS — R6889 Other general symptoms and signs: Secondary | ICD-10-CM

## 2015-08-31 DIAGNOSIS — R0683 Snoring: Secondary | ICD-10-CM | POA: Diagnosis not present

## 2015-08-31 DIAGNOSIS — R419 Unspecified symptoms and signs involving cognitive functions and awareness: Secondary | ICD-10-CM

## 2015-08-31 DIAGNOSIS — G4710 Hypersomnia, unspecified: Secondary | ICD-10-CM | POA: Diagnosis not present
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
## 2015-08-31 NOTE — Patient Instructions (Signed)
We will do an EEG (brainwave test), which we will schedule. We will call you with the results. We will do a brain scan, called MRI and call you with the test results. We will have to schedule you for this on a separate date. This test requires authorization from your insurance, and we will take care of the insurance process.  Please reconsider doing the sleep study testing as well.   We may consider a formal neuropsychological test (aka cognitive testing) for your memory complaints. This requires a referral to a trained and licensed neuropsychologist and will be a separate appointment at a different clinic.   You may have had a concussion, and as you know, there is no specific test for this and no specific medication. Symptom can last for weeks and sometimes months after a head injury (depends on the injury). Concussion is a mild form of traumatic brain injury, and usually occurs after a blow to the head. Loss of consciousness is not a requirement for the diagnosis of concussion or of postconcussion syndrome. The risk of post concussive symptoms does not appear to be correlated with the severity of the initial injury. Most people who have a concussion are without any residual symptoms. Some patients have symptoms that occur within the first 7-10 days of the initial injury and had complete resolution of her symptoms within 3 months. However, keep in mind that postconcussive symptoms can persist for a year and sometimes even longer. There is no specific test for the diagnosis of concussion. There is no specific treatment for concussion or postconcussive symptoms and care is supportive. Treatment is geared towards improving symptoms. Medications commonly used for migraines or tension headaches, including some antidepressants, appear to be helpful with postconcussive headaches. Medications include amitriptyline, Topamax, or gabapentin and others. Keep in mind that overuse of over-the-counter pain medications can  exacerbate post concussion headaches.   There are no medications currently recommended specifically for cognitive complaints after mild traumatic brain injury. Usually, time is the best treatment for postconcussive cognitive issues as most of the cognitive complaints resolve on their own in the first few weeks or months after the injury. Sometimes a referral to a psychiatrist or psychologist is helpful. Certain forms of cognitive therapy may be helpful, and relaxation therapy may also help.

## 2015-09-11 ENCOUNTER — Telehealth: Payer: Self-pay | Admitting: Neurology

## 2015-09-11 DIAGNOSIS — F419 Anxiety disorder, unspecified: Secondary | ICD-10-CM

## 2015-09-11 MED ORDER — ALPRAZOLAM 0.5 MG PO TABS: ORAL_TABLET | ORAL | Status: DC

## 2015-09-11 NOTE — Telephone Encounter (Signed)
Please fax xanax Rx to pharmacy for MRI scan.

## 2015-09-11 NOTE — Telephone Encounter (Signed)
Patient is having an MRI on 09/13/15 at 8:15 and would like something for anxiety sent to his pharmacy [address], [place].

## 2015-09-12 NOTE — Telephone Encounter (Signed)
Rx faxed

## 2015-09-13 ENCOUNTER — Ambulatory Visit (INDEPENDENT_AMBULATORY_CARE_PROVIDER_SITE_OTHER): Payer: BLUE CROSS/BLUE SHIELD

## 2015-09-13 DIAGNOSIS — G4710 Hypersomnia, unspecified: Secondary | ICD-10-CM

## 2015-09-13 DIAGNOSIS — R6889 Other general symptoms and signs: Secondary | ICD-10-CM

## 2015-09-13 DIAGNOSIS — R419 Unspecified symptoms and signs involving cognitive functions and awareness: Secondary | ICD-10-CM

## 2015-09-13 DIAGNOSIS — R0683 Snoring: Secondary | ICD-10-CM | POA: Diagnosis not present

## 2015-09-14 NOTE — Progress Notes (Signed)
Quick Note:  Please call patient regarding his recent brain MRI without contrast. His brain MRI without contrast from 09/13/2015 was reported as normal.  Star Age, MD, PhD [facility] ([place])  ______

## 2015-09-18 ENCOUNTER — Telehealth: Payer: Self-pay

## 2015-09-18 NOTE — Telephone Encounter (Signed)
-----   Message from Star Age, MD sent at 09/14/2015  6:44 PM EDT ----- Please call patient regarding his recent brain MRI without contrast. His brain MRI without contrast from 09/13/2015 was reported as normal.  Star Age, MD, PhD [facility] ([place])

## 2015-09-18 NOTE — Telephone Encounter (Signed)
LM that MRI was normal.

## 2015-09-22 ENCOUNTER — Ambulatory Visit (INDEPENDENT_AMBULATORY_CARE_PROVIDER_SITE_OTHER): Payer: BLUE CROSS/BLUE SHIELD | Admitting: Neurology

## 2015-09-22 DIAGNOSIS — R410 Disorientation, unspecified: Secondary | ICD-10-CM | POA: Diagnosis not present

## 2015-09-22 DIAGNOSIS — R6889 Other general symptoms and signs: Secondary | ICD-10-CM

## 2015-09-22 DIAGNOSIS — R419 Unspecified symptoms and signs involving cognitive functions and awareness: Secondary | ICD-10-CM

## 2015-09-22 DIAGNOSIS — G4710 Hypersomnia, unspecified: Secondary | ICD-10-CM

## 2015-09-22 DIAGNOSIS — R0683 Snoring: Secondary | ICD-10-CM

## 2015-09-22 NOTE — Procedures (Signed)
    History:  Brian Morrison is a 50 year old patient with a history of problems with memory loss with an associated concussion. The patient is forgetful, and he has difficulty with names. The patient has some drowsiness during the day. He is being evaluated for these symptoms.  This is a routine EEG. No skull defects are noted. Medications include albuterol inhaler, Cymbalta, Flonase, and Singulair.   EEG classification: Normal awake  Description of the recording: The background rhythms of this recording consists of a fairly well modulated medium amplitude alpha rhythm of 8 Hz that is reactive to eye opening and closure. As the record progresses, the patient appears to remain in the waking state throughout the recording. Photic stimulation was performed, resulting in a bilateral and symmetric photic driving response. Hyperventilation was also performed, resulting in a minimal buildup of the background rhythm activities without significant slowing seen. At no time during the recording does there appear to be evidence of spike or spike wave discharges or evidence of focal slowing. EKG monitor shows no evidence of cardiac rhythm abnormalities with a heart rate of 72.  Impression: This is a normal EEG recording in the waking state. No evidence of ictal or interictal discharges are seen.

## 2015-09-23 ENCOUNTER — Encounter: Payer: Self-pay | Admitting: Physician Assistant

## 2015-09-25 ENCOUNTER — Telehealth: Payer: Self-pay

## 2015-09-25 NOTE — Telephone Encounter (Signed)
LM (per DPR) that EEG was normal and to make f/u with Dr. Rexene Alberts.

## 2015-09-25 NOTE — Telephone Encounter (Deleted)
-----   Message from Star Age, MD sent at 09/25/2015  7:29 AM EDT ----- Please call and advise the patient that the EEG or brain wave test we performed was reported as normal in the awake state. We checked for abnormal electrical discharges in the brain waves and the report suggested normal findings. No further action is required on this test at this time. Please remind patient to keep any upcoming appointments or tests and to call us with any interim questions, concerns, problems or updates. Thanks,  Star Age, MD, PhD

## 2015-09-25 NOTE — Telephone Encounter (Signed)
-----   Message from Star Age, MD sent at 09/25/2015  7:29 AM EDT ----- Please call and advise the patient that the EEG or brain wave test we performed was reported as normal in the awake state. We checked for abnormal electrical discharges in the brain waves and the report suggested normal findings. No further action is required on this test at this time. Please remind patient to keep any upcoming appointments or tests and to call us with any interim questions, concerns, problems or updates. Thanks,  Star Age, MD, PhD

## 2015-09-25 NOTE — Progress Notes (Signed)
Please call and advise the patient that the EEG or brain wave test we performed was reported as normal in the awake state. We checked for abnormal electrical discharges in the brain waves and the report suggested normal findings. No further action is required on this test at this time. Please remind patient to keep any upcoming appointments or tests and to call us with any interim questions, concerns, problems or updates. Thanks,  [name], MD, PhD  

## 2015-09-29 ENCOUNTER — Ambulatory Visit: Payer: BLUE CROSS/BLUE SHIELD | Admitting: Neurology

## 2015-11-27 ENCOUNTER — Ambulatory Visit (INDEPENDENT_AMBULATORY_CARE_PROVIDER_SITE_OTHER): Payer: BLUE CROSS/BLUE SHIELD | Admitting: Neurology

## 2015-11-27 ENCOUNTER — Encounter: Payer: Self-pay | Admitting: Physician Assistant

## 2015-11-27 ENCOUNTER — Encounter: Payer: Self-pay | Admitting: Neurology

## 2015-11-27 VITALS — BP 136/78 | HR 76 | Resp 18 | Ht 73.75 in | Wt 226.0 lb

## 2015-11-27 DIAGNOSIS — R419 Unspecified symptoms and signs involving cognitive functions and awareness: Secondary | ICD-10-CM | POA: Diagnosis not present
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
## 2015-11-27 NOTE — Patient Instructions (Addendum)
Your exam looks good.  EEG and brain scan were good too.  We can see you back as needed! Please remember to try to maintain good sleep hygiene, which means: Keep a regular sleep and wake schedule, try not to exercise or have a meal within 2 hours of your bedtime, try to keep your bedroom conducive for sleep, that is, cool and dark, without light distractors such as an illuminated alarm clock, and refrain from watching TV right before sleep or in the middle of the night and do not keep the TV or radio on during the night. Also, try not to use or play on electronic devices at bedtime, such as your cell phone, tablet PC or laptop. If you like to read at bedtime on an electronic device, try to dim the background light as much as possible. Do not eat in the middle of the night.   Try to hydrate well.   We can pursue sleep study testing when you are ready for it.

## 2015-11-30 NOTE — Telephone Encounter (Signed)
Yes please compose not per his specifications.

## 2015-11-30 NOTE — Telephone Encounter (Signed)
Legrand Como, I do not see a letter written previously for pt to re-send. I see a request for one on 7/22 email and then again in this one. Do you know what documentation/letter it is that you gave him before?

## 2015-12-12 ENCOUNTER — Other Ambulatory Visit: Payer: Self-pay | Admitting: Family Medicine

## 2015-12-12 DIAGNOSIS — R062 Wheezing: Secondary | ICD-10-CM

## 2016-02-08 ENCOUNTER — Telehealth: Payer: Self-pay
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
## 2016-02-12 ENCOUNTER — Telehealth: Payer: Self-pay | Admitting: Emergency Medicine

## 2016-02-12 ENCOUNTER — Encounter: Payer: Self-pay | Admitting: Emergency Medicine
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
## 2016-04-19 ENCOUNTER — Ambulatory Visit (INDEPENDENT_AMBULATORY_CARE_PROVIDER_SITE_OTHER): Payer: BLUE CROSS/BLUE SHIELD | Admitting: Physician Assistant

## 2016-04-19 VITALS — BP 116/72 | HR 99 | Temp 100.7°F | Resp 16 | Ht 73.75 in | Wt 225.0 lb

## 2016-04-19 DIAGNOSIS — R52 Pain, unspecified: Secondary | ICD-10-CM | POA: Diagnosis not present

## 2016-04-19 DIAGNOSIS — R059 Cough, unspecified: Secondary | ICD-10-CM

## 2016-04-19 DIAGNOSIS — R6889 Other general symptoms and signs: Secondary | ICD-10-CM | POA: Diagnosis not present

## 2016-04-19 DIAGNOSIS — R05 Cough: Secondary | ICD-10-CM

## 2016-04-19 MED ORDER — GUAIFENESIN ER 1200 MG PO TB12: 1.0000 | ORAL_TABLET | Freq: Two times a day (BID) | ORAL | 1 refills | Status: DC | PRN

## 2016-04-19 MED ORDER — BENZONATATE 100 MG PO CAPS: 100.0000 mg | ORAL_CAPSULE | Freq: Three times a day (TID) | ORAL | 0 refills | Status: DC | PRN

## 2016-04-19 MED ORDER — HYDROCOD POLST-CPM POLST ER 10-8 MG/5ML PO SUER: 5.0000 mL | Freq: Every evening | ORAL | 0 refills | Status: DC | PRN

## 2016-04-19 MED ORDER — OSELTAMIVIR PHOSPHATE 75 MG PO CAPS: 75.0000 mg | ORAL_CAPSULE | Freq: Two times a day (BID) | ORAL | 0 refills | Status: DC

## 2016-04-19 NOTE — Patient Instructions (Addendum)
IF you received an x-ray today, you will receive an invoice from [facility]. Please contact [facility] at [phone] with questions or concerns regarding your invoice.   IF you received labwork today, you will receive an invoice from [facility]. Please contact [facility] at [phone] with questions or concerns regarding your invoice.   Our billing staff will not be able to assist you with questions regarding bills from these companies.  You will be contacted with the lab results as soon as they are available. The fastest way to get your results is to activate your My Chart account. Instructions are located on the last page of this paperwork. If you have not heard from [place] regarding the results in 2 weeks, please contact this office.    Continue to hydrate well. Continue using the tylenol for pain and fever.  You can continue your flonase.   And you are also able to use the albuterol every 6 hours routinely for the first 24 hours.   Influenza, Adult Influenza ("the flu") is an infection in the lungs, nose, and throat (respiratory tract). It is caused by a virus. The flu causes many common cold symptoms, as well as a high fever and body aches. It can make you feel very sick. The flu spreads easily from person to person (is contagious). Getting a flu shot (influenza vaccination) every year is the best way to prevent the flu. Follow these instructions at home:  Take over-the-counter and prescription medicines only as told by your doctor.  Use a cool mist humidifier to add moisture (humidity) to the air in your home. This can make it easier to breathe.  Rest as needed.  Drink enough fluid to keep your pee (urine) clear or pale yellow.  Cover your mouth and nose when you cough or sneeze.  Wash your hands with soap and water often, especially after you cough or sneeze. If you cannot use soap and water, use hand sanitizer.  Stay home from work or school as told by  your doctor. Unless you are visiting your doctor, try to avoid leaving home until your fever has been gone for 24 hours without the use of medicine.  Keep all follow-up visits as told by your doctor. This is important. How is this prevented?  Getting a yearly (annual) flu shot is the best way to avoid getting the flu. You may get the flu shot in late summer, fall, or winter. Ask your doctor when you should get your flu shot.  Wash your hands often or use hand sanitizer often.  Avoid contact with people who are sick during cold and flu season.  Eat healthy foods.  Drink plenty of fluids.  Get enough sleep.  Exercise regularly. Contact a doctor if:  You get new symptoms.  You have:  Chest pain.  Watery poop (diarrhea).  A fever.  Your cough gets worse.  You start to have more mucus.  You feel sick to your stomach (nauseous).  You throw up (vomit). Get help right away if:  You start to be short of breath or have trouble breathing.  Your skin or nails turn a bluish color.  You have very bad pain or stiffness in your neck.  You get a sudden headache.  You get sudden pain in your face or ear.  You cannot stop throwing up. This information is not intended to replace advice given to you by your health care provider. Make sure you discuss any questions you have  with your health care provider. Document Released: 11/28/2007 Document Revised: 07/27/2015 Document Reviewed: 12/13/2014 Elsevier Interactive Patient Education  2017 [employer].

## 2016-04-19 NOTE — Progress Notes (Signed)
**Note Brian-Identified via Obfuscation** [facility] [address] [place] [address] [phone]  Date:  04/19/2016   Name:  Brian Morrison   DOB:  [birth year]   MRN:  [id number]  PCP:  Pcp Not In System    History of Present Illness:  Brian Morrison is a 51 y.o. male patient who presents to [facility] for cc of cough, fever, bodyaches, and fatigue.      3 days ago, was having fatigue, and low grade fever.  Yesterday, fever chills, bodyaches, dry cough that is keeping him up all night.  He has fatigue and no energy.  He uses his proventil with some trouble with wheezing.  It is not constant.  Daughter diagnosed with influenza on Monday.     Patient Active Problem List   Diagnosis Date Noted  . Asthma with exacerbation 07/18/2015  . Allergic rhinitis 07/18/2015  . Depression with anxiety 12/04/2011    Past Medical History:  Diagnosis Date  . Allergic rhinitis   . Anxiety   . Asthma   . Depression     No past surgical history on file.  Social History  Substance Use Topics  . Smoking status: Former Smoker    Start date: 12/03/1997  . Smokeless tobacco: Never Used  . Alcohol use No    Family History  Problem Relation Age of Onset  . COPD Mother   . Lung cancer Maternal Grandmother   . Alzheimer's disease Maternal Grandfather   . Heart disease Paternal Grandfather     No Known Allergies  Medication list has been reviewed and updated.  Current Outpatient Prescriptions on File Prior to Visit  Medication Sig Dispense Refill  . DULoxetine (CYMBALTA) 30 MG capsule Take 1-2 capsules daily for anxiety depression. 60 capsule 11  . fluticasone (FLONASE) 50 MCG/ACT nasal spray SHAKE LIQUID AND USE 2 SPRAYS IN EACH NOSTRIL EVERY DAY 16 g 11  . PROVENTIL HFA 108 (90 Base) MCG/ACT inhaler INHALE 2 PUFFS INTO THE LUNGS EVERY 6 HOURS AS NEEDED FOR WHEEZING OR SHORTNESS OF BREATH 6.7 g 8  . montelukast (SINGULAIR) 10 MG tablet Take 1 tablet (10 mg total) by mouth at bedtime. (Patient not taking:  Reported on 04/19/2016) 30 tablet 11   No current facility-administered medications on file prior to visit.     ROS ROS otherwise unremarkalbe unless listed above.   Physical Examination: BP 116/72   Pulse 99   Temp (!) 100.7 F (38.2 C) (Oral)   Resp 16   Ht 6' 1.75" (1.873 m)   Wt 225 lb (102.1 kg)   SpO2 99%   BMI 29.08 kg/m  Ideal Body Weight: Weight in (lb) to have BMI = 25: 193  Physical Exam  Constitutional: He is oriented to person, place, and time. He appears well-developed and well-nourished. No distress.  HENT:  Head: Normocephalic and atraumatic.  Right Ear: Tympanic membrane, external ear and ear canal normal.  Left Ear: Tympanic membrane, external ear and ear canal normal.  Nose: Mucosal edema and rhinorrhea present. Right sinus exhibits no maxillary sinus tenderness and no frontal sinus tenderness. Left sinus exhibits no maxillary sinus tenderness and no frontal sinus tenderness.  Mouth/Throat: No uvula swelling. No oropharyngeal exudate, posterior oropharyngeal edema or posterior oropharyngeal erythema.  Eyes: Conjunctivae, EOM and lids are normal. Pupils are equal, round, and reactive to light. Right eye exhibits normal extraocular motion. Left eye exhibits normal extraocular motion.  Neck: Trachea normal and full passive range of motion without pain. No edema  and no erythema present.  Cardiovascular: Normal rate.   Pulmonary/Chest: Effort normal. No respiratory distress. He has no decreased breath sounds. He has no wheezes. He has no rhonchi.  Neurological: He is alert and oriented to person, place, and time.  Skin: Skin is warm and dry. He is not diaphoretic.  Psychiatric: He has a normal mood and affect. His behavior is normal.    Assessment and Plan: Brian Morrison is a 51 y.o. male who is here today for cc of cough. Flu-like symptoms - Plan: oseltamivir (TAMIFLU) 75 MG capsule  Cough - Plan: Guaifenesin (MUCINEX MAXIMUM STRENGTH) 1200 MG TB12,  benzonatate (TESSALON) 100 MG capsule, chlorpheniramine-HYDROcodone (TUSSIONEX PENNKINETIC ER) 10-8 MG/5ML SUER  Body aches - Plan: oseltamivir (TAMIFLU) 75 MG capsule  Ivar Drape, PA-C [facility] 2/22/20187:51 AM

## 2016-04-25 ENCOUNTER — Encounter: Payer: Self-pay | Admitting: Physician Assistant

## 2016-08-04 ENCOUNTER — Other Ambulatory Visit: Payer: Self-pay | Admitting: Emergency Medicine

## 2016-08-04 DIAGNOSIS — R062 Wheezing: Secondary | ICD-10-CM

## 2016-08-04 DIAGNOSIS — J4520 Mild intermittent asthma, uncomplicated: Secondary | ICD-10-CM

## 2016-08-08 ENCOUNTER — Other Ambulatory Visit: Payer: Self-pay | Admitting: Emergency Medicine

## 2016-08-08 DIAGNOSIS — J4520 Mild intermittent asthma, uncomplicated: Secondary | ICD-10-CM

## 2016-08-08 DIAGNOSIS — R062 Wheezing: Secondary | ICD-10-CM

## 2016-08-22 ENCOUNTER — Other Ambulatory Visit: Payer: Self-pay | Admitting: Emergency Medicine

## 2016-08-22 DIAGNOSIS — F411 Generalized anxiety disorder: Secondary | ICD-10-CM

## 2016-09-09 ENCOUNTER — Other Ambulatory Visit: Payer: Self-pay | Admitting: Physician Assistant

## 2016-09-09 DIAGNOSIS — R062 Wheezing: Secondary | ICD-10-CM

## 2016-09-09 DIAGNOSIS — J4520 Mild intermittent asthma, uncomplicated: Secondary | ICD-10-CM

## 2016-09-10 ENCOUNTER — Ambulatory Visit (INDEPENDENT_AMBULATORY_CARE_PROVIDER_SITE_OTHER): Payer: BLUE CROSS/BLUE SHIELD | Admitting: Physician Assistant

## 2016-09-10 ENCOUNTER — Ambulatory Visit (INDEPENDENT_AMBULATORY_CARE_PROVIDER_SITE_OTHER): Payer: BLUE CROSS/BLUE SHIELD

## 2016-09-10 ENCOUNTER — Encounter: Payer: Self-pay | Admitting: Physician Assistant

## 2016-09-10 VITALS — BP 125/76 | HR 64 | Temp 98.1°F | Resp 17 | Ht 74.5 in | Wt 220.0 lb

## 2016-09-10 DIAGNOSIS — F411 Generalized anxiety disorder: Secondary | ICD-10-CM | POA: Diagnosis not present

## 2016-09-10 DIAGNOSIS — R062 Wheezing: Secondary | ICD-10-CM | POA: Diagnosis not present

## 2016-09-10 DIAGNOSIS — M79672 Pain in left foot: Secondary | ICD-10-CM | POA: Diagnosis not present

## 2016-09-10 DIAGNOSIS — J4520 Mild intermittent asthma, uncomplicated: Secondary | ICD-10-CM

## 2016-09-10 DIAGNOSIS — M7732 Calcaneal spur, left foot: Secondary | ICD-10-CM

## 2016-09-10 MED ORDER — DULOXETINE HCL 30 MG PO CPEP: ORAL_CAPSULE | ORAL | 0 refills | Status: DC

## 2016-09-10 MED ORDER — FLUTICASONE PROPIONATE 50 MCG/ACT NA SUSP: NASAL | 6 refills | Status: DC

## 2016-09-10 MED ORDER — MELOXICAM 15 MG PO TABS: 15.0000 mg | ORAL_TABLET | Freq: Every day | ORAL | 0 refills | Status: DC

## 2016-09-10 NOTE — Patient Instructions (Addendum)
We can discontinue the duloxetine over the next 3 weeks.  Studies show that 2-4 weeks is a sufficient time to discontinue the duloxetine.  I would like you to follow up within 3-4 weeks.  We may also want to revisit in 4 additional weeks, if all is well, to make sure we are doing fine with not having the duloxetine.  Duloxetine  1.5 tablets daily for 1 week (45mg ) 1 tablet daily for 1 week (30mg ) Half tablet for 1 week (15)=3 weeks total.    Please soak the foot in ice three times per week for 15 minutes.   Heel Spur A heel spur is a bony growth that forms on the bottom of your heel bone (calcaneus). Heel spurs are common and do not always cause pain. However, heel spurs often cause inflammation in the strong band of tissue that runs underneath the bone of your foot (plantar fascia). When this happens, you may feel pain on the bottom of your foot, near your heel. What are the causes? The cause of heel spurs is not completely understood. They may be caused by pressure on the heel. Or, they may stem from the muscle attachments (tendons) near the spur pulling on the heel. What increases the risk? You may be at risk for a heel spur if you:  Are older than 40.  Are overweight.  Have wear and tear arthritis (osteoarthritis).  Have plantar fascia inflammation.  What are the signs or symptoms? Some people have heel spurs but no symptoms. If you do have symptoms, they may include:  Pain in the bottom of your heel.  Pain that is worse when you first get out of bed.  Pain that gets worse after walking or standing.  How is this diagnosed? Your health care provider may diagnose a heel spur based on your symptoms and a physical exam. You may also have an X-ray of your foot to check for a bony growth coming from the calcaneus. How is this treated? Treatment aims to relieve the pain from the heel spur. This may include:  Stretching exercises.  Losing weight.  Wearing specific shoes, inserts,  or orthotics for comfort and support.  Wearing splints at night to properly position your feet.  Taking over-the-counter medicine to relieve pain.  Being treated with high-intensity sound waves to break up the heel spur (extracorporeal shock wave therapy).  Getting steroid injections in your heel to reduce swelling and ease pain.  Having surgery if your heel spur causes long-term (chronic) pain.  Follow these instructions at home:  Take medicines only as directed by your health care provider.  Ask your health care provider if you should use ice or cold packs on the painful areas of your heel or foot.  Avoid activities that cause you pain until you recover or as directed by your health care provider.  Stretch before exercising or being physically active.  Wear supportive shoes that fit well as directed by your health care provider. You might need to buy new shoes. Wearing old shoes or shoes that do not fit correctly may not provide the support that you need.  Lose weight if your health care provider thinks you should. This can relieve pressure on your foot that may be causing pain and discomfort. Contact a health care provider if:  Your pain continues or gets worse. This information is not intended to replace advice given to you by your health care provider. Make sure you discuss any questions you have with your  health care provider. Document Released: 03/27/2005 Document Revised: 07/27/2015 Document Reviewed: 04/21/2013 Elsevier Interactive Patient Education  2018 [employer].    IF you received an x-ray today, you will receive an invoice from [facility] Radiology. Please contact [facility] Radiology at [phone] with questions or concerns regarding your invoice.   IF you received labwork today, you will receive an invoice from [facility]. Please contact [facility] at [phone] with questions or concerns regarding your invoice.   Our billing staff will not be able to assist  you with questions regarding bills from these companies.  You will be contacted with the lab results as soon as they are available. The fastest way to get your results is to activate your My Chart account. Instructions are located on the last page of this paperwork. If you have not heard from [place] regarding the results in 2 weeks, please contact this office.

## 2016-09-10 NOTE — Progress Notes (Signed)
PRIMARY CARE AT [facility] [address], [place] [address] [phone]  Date:  09/10/2016   Name:  Brian Morrison   DOB:  1965-03-09   MRN:  [id number]  PCP:  System, Pcp Not In    History of Present Illness:  Brian Morrison is a 51 y.o. male patient who presents to PCP with  Chief Complaint  Patient presents with  . Medication Refill    flonase    Patient would like to be removed off of the cymbalta at this time.  Last year, he was attempting to taper off, but due to a concussion, he was advised to not continue discontinuance at thist ime.   He has been on it for years with cymbalta.  Initially went on for relationship/family, and work issues.  He also did counseling.   He will notice that he will have a fog that may last for 1 day.  He has not noticed any increase in agitation.  No dysequilibrium.   Current stressors are still present at the root of the start of cymbalta in the first place.  However he notes that he utilizes coping skills, from therapy which helps him with this.  He also notes heel pain of left heel that is prominent more in the morning and while driving.    Patient Active Problem List   Diagnosis Date Noted  . Asthma with exacerbation 07/18/2015  . Allergic rhinitis 07/18/2015  . Depression with anxiety 12/04/2011    Past Medical History:  Diagnosis Date  . Allergic rhinitis   . Anxiety   . Asthma   . Depression     No past surgical history on file.  Social History  Substance Use Topics  . Smoking status: Former Smoker    Start date: 12/03/1997  . Smokeless tobacco: Never Used  . Alcohol use No    Family History  Problem Relation Age of Onset  . COPD Mother   . Lung cancer Maternal Grandmother   . Alzheimer's disease Maternal Grandfather   . Heart disease Paternal Grandfather     No Known Allergies  Medication list has been reviewed and updated.  Current Outpatient Prescriptions on File Prior to Visit  Medication Sig Dispense  Refill  . benzonatate (TESSALON) 100 MG capsule Take 1-2 capsules (100-200 mg total) by mouth 3 (three) times daily as needed for cough. 40 capsule 0  . chlorpheniramine-HYDROcodone (TUSSIONEX PENNKINETIC ER) 10-8 MG/5ML SUER Take 5 mLs by mouth at bedtime as needed. 60 mL 0  . DULoxetine (CYMBALTA) 30 MG capsule TAKE 1 TO 2 CAPSULES BY MOUTH DAILY FOR ANXIETY/DEPRESSION. 60 capsule 0  . fluticasone (FLONASE) 50 MCG/ACT nasal spray SHAKE LIQUID AND USE 2 SPRAYS IN EACH NOSTRIL EVERY DAY 16 g 0  . Guaifenesin (MUCINEX MAXIMUM STRENGTH) 1200 MG TB12 Take 1 tablet (1,200 mg total) by mouth every 12 (twelve) hours as needed. 14 tablet 1  . montelukast (SINGULAIR) 10 MG tablet TAKE 1 TABLET(10 MG) BY MOUTH AT BEDTIME 30 tablet 0  . oseltamivir (TAMIFLU) 75 MG capsule Take 1 capsule (75 mg total) by mouth 2 (two) times daily. 10 capsule 0  . PROVENTIL HFA 108 (90 Base) MCG/ACT inhaler INHALE 2 PUFFS INTO THE LUNGS EVERY 6 HOURS AS NEEDED FOR WHEEZING OR SHORTNESS OF BREATH 6.7 g 8   No current facility-administered medications on file prior to visit.     ROS ROS otherwise unremarkable unless listed above.  Physical Examination: BP 125/76   Pulse 64  Temp 98.1 F (36.7 C) (Oral)   Resp 17   Ht 6' 2.5" (1.892 m)   Wt 220 lb (99.8 kg)   SpO2 98%   BMI 27.87 kg/m  Ideal Body Weight: Weight in (lb) to have BMI = 25: 196.9  Physical Exam  Constitutional: He is oriented to person, place, and time. He appears well-developed and well-nourished. No distress.  HENT:  Head: Normocephalic and atraumatic.  Eyes: Pupils are equal, round, and reactive to light. Conjunctivae and EOM are normal.  Cardiovascular: Normal rate.   Pulmonary/Chest: Effort normal. No respiratory distress.  Musculoskeletal:       Left ankle: Achilles tendon normal.  Tender at the base of the calcaneal along plantar aspect.  No tenderness distally.  Neurological: He is alert and oriented to person, place, and time.  Skin:  Skin is warm and dry. He is not diaphoretic.  Psychiatric: He has a normal mood and affect. His behavior is normal.     Assessment and Plan: Brian Morrison is a 51 y.o. male who is here today for medication change, and heel pain. Heel spur, given mobic with precautions, icing.  Will contact me if continues to not improve after 2 weeks.  Will refer him to podiatry--[facility]... Given taper 45-30-15 each 1 week with recommendations to taper duloxetine in 2-3 weeks.  flonase refilled.  Heel spur, left  Pain of left heel - Plan: DG [place] Left  Reactive airway disease, mild intermittent, uncomplicated - Plan: fluticasone (FLONASE) 50 MCG/ACT nasal spray  Wheezing - Plan: fluticasone (FLONASE) 50 MCG/ACT nasal spray  Generalized anxiety disorder - Plan: DULoxetine (CYMBALTA) 30 MG capsule, DISCONTINUED: DULoxetine (CYMBALTA) 30 MG capsule  Ivar Drape, PA-C [facility] 7/13/20189:43 AM

## 2016-09-30 ENCOUNTER — Other Ambulatory Visit: Payer: Self-pay | Admitting: Physician Assistant

## 2016-09-30 DIAGNOSIS — F411 Generalized anxiety disorder: Secondary | ICD-10-CM

## 2016-10-03 ENCOUNTER — Other Ambulatory Visit: Payer: Self-pay | Admitting: Physician Assistant

## 2016-10-07 ENCOUNTER — Other Ambulatory Visit: Payer: Self-pay | Admitting: Physician Assistant

## 2016-10-09 ENCOUNTER — Encounter: Payer: Self-pay | Admitting: Physician Assistant

## 2016-10-12 ENCOUNTER — Other Ambulatory Visit: Payer: Self-pay | Admitting: Physician Assistant

## 2016-10-12 DIAGNOSIS — F411 Generalized anxiety disorder: Secondary | ICD-10-CM

## 2016-10-12 MED ORDER — DULOXETINE HCL 30 MG PO CPEP: 60.0000 mg | ORAL_CAPSULE | Freq: Every day | ORAL | 0 refills | Status: DC

## 2016-10-29 ENCOUNTER — Other Ambulatory Visit: Payer: Self-pay | Admitting: Physician Assistant

## 2017-01-13 ENCOUNTER — Ambulatory Visit (INDEPENDENT_AMBULATORY_CARE_PROVIDER_SITE_OTHER): Payer: BLUE CROSS/BLUE SHIELD

## 2017-01-13 ENCOUNTER — Ambulatory Visit: Payer: BLUE CROSS/BLUE SHIELD | Admitting: Urgent Care

## 2017-01-13 ENCOUNTER — Encounter: Payer: Self-pay | Admitting: Urgent Care

## 2017-01-13 VITALS — BP 123/81 | HR 71 | Temp 98.2°F | Resp 16 | Ht 74.5 in | Wt 218.0 lb

## 2017-01-13 DIAGNOSIS — R059 Cough, unspecified: Secondary | ICD-10-CM

## 2017-01-13 DIAGNOSIS — R05 Cough: Secondary | ICD-10-CM

## 2017-01-13 DIAGNOSIS — E782 Mixed hyperlipidemia: Secondary | ICD-10-CM | POA: Diagnosis not present

## 2017-01-13 DIAGNOSIS — J4530 Mild persistent asthma, uncomplicated: Secondary | ICD-10-CM | POA: Diagnosis not present

## 2017-01-13 DIAGNOSIS — R0989 Other specified symptoms and signs involving the circulatory and respiratory systems: Secondary | ICD-10-CM

## 2017-01-13 MED ORDER — HYDROCODONE-HOMATROPINE 5-1.5 MG/5ML PO SYRP: 5.0000 mL | ORAL_SOLUTION | Freq: Every evening | ORAL | 0 refills | Status: DC | PRN

## 2017-01-13 MED ORDER — BENZONATATE 100 MG PO CAPS: 100.0000 mg | ORAL_CAPSULE | Freq: Three times a day (TID) | ORAL | 0 refills | Status: DC | PRN

## 2017-01-13 MED ORDER — PSEUDOEPHEDRINE HCL ER 120 MG PO TB12: 120.0000 mg | ORAL_TABLET | Freq: Two times a day (BID) | ORAL | 3 refills | Status: DC

## 2017-01-13 NOTE — Progress Notes (Signed)
   MRN: [id number] DOB: March 04, 1966  Subjective:   Brian Morrison is a 51 y.o. male presenting for chief complaint of chest congestion (x5 days) and Cough (x5 days with green/brown sputum)  Reports 5 day history of productive cough, chest congestion, sinus congestion, mild wheezing. Cough started eliciting chest pain, mild shob, chest congestion. Has tried Mucinex, APAP with minimal relief. Denies fever, sinus pain, n/v, abdominal pain. Has a history of asthma, has been using albuterol inhaler once every 4-6 hours with some relief. Denies smoking cigarettes. Patient is requesting a repeat of his cholesterol panel for life insurance purposes.  Brian Morrison has a current medication list which includes the following prescription(s): proventil hfa. Also has No Known Allergies.  Brian Morrison  has a past medical history of Allergic rhinitis, Anxiety, Asthma, and Depression. Denies past surgical history.  Objective:   Vitals: BP 123/81   Pulse 71   Temp 98.2 F (36.8 C) (Oral)   Resp 16   Ht 6' 2.5" (1.892 m)   Wt 218 lb (98.9 kg)   SpO2 99%   BMI 27.62 kg/m   Physical Exam  Constitutional: He is oriented to person, place, and time. He appears well-developed and well-nourished.  HENT:  TM's intact bilaterally, no effusions or erythema. Nasal turbinates pink and moist, nasal passages patent. No sinus tenderness. Oropharynx with moderate post-nasal drainage, mucous membranes moist.  Eyes: Right eye exhibits no discharge. Left eye exhibits no discharge.  Neck: Normal range of motion. Neck supple.  Cardiovascular: Normal rate, regular rhythm and intact distal pulses. Exam reveals no gallop and no friction rub.  No murmur heard. Pulmonary/Chest: No respiratory distress. He has no wheezes. He has no rales.  Lymphadenopathy:    He has no cervical adenopathy.  Neurological: He is alert and oriented to person, place, and time.  Skin: Skin is warm and dry.   Dg Chest 2 View  Result Date:  01/13/2017 CLINICAL DATA:  Chest congestion cough.  History of asthma. EXAM: CHEST  2 VIEW COMPARISON:  01/11/2012 FINDINGS: Heart size is normal. Mediastinal shadows are normal. There is mild central bronchial thickening but no infiltrate, collapse or effusion. No bone abnormality. IMPRESSION: Mild central bronchial thickening. Normal lung volumes. No infiltrate or collapse. Electronically Signed   By: Nelson Chimes M.D.   On: 01/13/2017 10:13   Assessment and Plan :   1. Cough 2. Chest congestion 3. Mild persistent asthma without complication - Start supportive care for what I suspect is a viral respiratory infection. Use cough suppression medications. Schedule albuterol inhaler. Return-to-clinic precautions discussed, patient verbalized understanding.   4. Mixed hyperlipidemia - Lipid panel pending  Jaynee Eagles, PA-C [facility] [phone] 01/13/2017  9:57 AM

## 2017-01-13 NOTE — Patient Instructions (Addendum)
For sore throat try using a honey-based tea. Use 3 teaspoons of honey with juice squeezed from half lemon. Place shaved pieces of ginger into 1/2-1 cup of water and warm over stove top. Then mix the ingredients and repeat every 4 hours as needed.       Cough, Adult A cough helps to clear your throat and lungs. A cough may last only 2-3 weeks (acute), or it may last longer than 8 weeks (chronic). Many different things can cause a cough. A cough may be a sign of an illness or another medical condition. Follow these instructions at home:  Pay attention to any changes in your cough.  Take medicines only as told by your doctor. ? If you were prescribed an antibiotic medicine, take it as told by your doctor. Do not stop taking it even if you start to feel better. ? Talk with your doctor before you try using a cough medicine.  Drink enough fluid to keep your pee (urine) clear or pale yellow.  If the air is dry, use a cold steam vaporizer or humidifier in your home.  Stay away from things that make you cough at work or at home.  If your cough is worse at night, try using extra pillows to raise your head up higher while you sleep.  Do not smoke, and try not to be around smoke. If you need help quitting, ask your doctor.  Do not have caffeine.  Do not drink alcohol.  Rest as needed. Contact a doctor if:  You have new problems (symptoms).  You cough up yellow fluid (pus).  Your cough does not get better after 2-3 weeks, or your cough gets worse.  Medicine does not help your cough and you are not sleeping well.  You have pain that gets worse or pain that is not helped with medicine.  You have a fever.  You are losing weight and you do not know why.  You have night sweats. Get help right away if:  You cough up blood.  You have trouble breathing.  Your heartbeat is very fast. This information is not intended to replace advice given to you by your health care provider. Make  sure you discuss any questions you have with your health care provider. Document Released: 11/01/2010 Document Revised: 07/27/2015 Document Reviewed: 04/27/2014 Elsevier Interactive Patient Education  2018 [employer].     IF you received an x-ray today, you will receive an invoice from [facility] Radiology. Please contact [facility] Radiology at [phone] with questions or concerns regarding your invoice.   IF you received labwork today, you will receive an invoice from [facility]. Please contact [facility] at [phone] with questions or concerns regarding your invoice.   Our billing staff will not be able to assist you with questions regarding bills from these companies.  You will be contacted with the lab results as soon as they are available. The fastest way to get your results is to activate your My Chart account. Instructions are located on the last page of this paperwork. If you have not heard from [place] regarding the results in 2 weeks, please contact this office.

## 2017-01-14 LAB — LIPID PANEL
Chol/HDL Ratio: 4.4 ratio (ref 0.0–5.0)
Cholesterol, Total: 175 mg/dL (ref 100–199)
HDL: 40 mg/dL (ref >39)
LDL Calculated: 112 mg/dL — ABNORMAL HIGH (ref 0–99)
Triglycerides: 117 mg/dL (ref 0–149)
VLDL Cholesterol Cal: 23 mg/dL (ref 5–40)

## 2017-03-06 DIAGNOSIS — M25512 Pain in left shoulder: Secondary | ICD-10-CM | POA: Diagnosis not present

## 2017-03-12 DIAGNOSIS — M25512 Pain in left shoulder: Secondary | ICD-10-CM | POA: Diagnosis not present

## 2017-03-13 DIAGNOSIS — M25512 Pain in left shoulder: Secondary | ICD-10-CM | POA: Diagnosis not present

## 2017-03-23 ENCOUNTER — Other Ambulatory Visit: Payer: Self-pay | Admitting: Physician Assistant

## 2017-03-23 DIAGNOSIS — J4520 Mild intermittent asthma, uncomplicated: Secondary | ICD-10-CM

## 2017-03-23 DIAGNOSIS — R062 Wheezing: Secondary | ICD-10-CM

## 2017-04-24 ENCOUNTER — Other Ambulatory Visit: Payer: Self-pay | Admitting: Physician Assistant

## 2017-04-24 DIAGNOSIS — R062 Wheezing: Secondary | ICD-10-CM

## 2017-04-24 DIAGNOSIS — J4520 Mild intermittent asthma, uncomplicated: Secondary | ICD-10-CM

## 2017-04-24 NOTE — Telephone Encounter (Signed)
flonase refill Last OV: 01/13/17 Last Refill:03/23/17 Pharmacy: [facility] [place], [place]

## 2017-04-28 ENCOUNTER — Other Ambulatory Visit: Payer: Self-pay

## 2017-04-28 DIAGNOSIS — J4520 Mild intermittent asthma, uncomplicated: Secondary | ICD-10-CM

## 2017-04-28 DIAGNOSIS — R062 Wheezing: Secondary | ICD-10-CM

## 2017-04-28 MED ORDER — FLUTICASONE PROPIONATE 50 MCG/ACT NA SUSP: NASAL | 0 refills | Status: DC

## 2017-06-02 ENCOUNTER — Other Ambulatory Visit: Payer: Self-pay | Admitting: Physician Assistant

## 2017-06-02 DIAGNOSIS — R062 Wheezing: Secondary | ICD-10-CM

## 2017-06-02 DIAGNOSIS — J4520 Mild intermittent asthma, uncomplicated: Secondary | ICD-10-CM

## 2017-06-11 ENCOUNTER — Encounter: Payer: Self-pay | Admitting: Physician Assistant

## 2017-06-14 ENCOUNTER — Other Ambulatory Visit: Payer: Self-pay | Admitting: Physician Assistant

## 2017-06-14 DIAGNOSIS — R062 Wheezing: Secondary | ICD-10-CM

## 2017-06-14 DIAGNOSIS — J4520 Mild intermittent asthma, uncomplicated: Secondary | ICD-10-CM

## 2017-06-29 ENCOUNTER — Other Ambulatory Visit: Payer: Self-pay | Admitting: Physician Assistant

## 2017-06-29 DIAGNOSIS — R062 Wheezing: Secondary | ICD-10-CM

## 2017-06-29 DIAGNOSIS — J4520 Mild intermittent asthma, uncomplicated: Secondary | ICD-10-CM

## 2017-08-12 ENCOUNTER — Telehealth: Payer: Self-pay | Admitting: Urgent Care

## 2017-08-12 NOTE — Telephone Encounter (Signed)
Copied from [place] [id number]. Topic: Quick Communication - Rx Refill/Question >> Aug 12, 2017  4:36 PM Selinda Flavin B, NT wrote: Medication: fluticasone (FLONASE) 50 MCG/ACT nasal spray  Has the patient contacted their pharmacy? Yes.   (Agent: If no, request that the patient contact the pharmacy for the refill.) (Agent: If yes, when and what did the pharmacy advise?)  Preferred Pharmacy (with phone number or street name): [id number] - [place], [place] [address] [place]: Please be advised that RX refills may take up to 3 business days. We ask that you follow-up with your pharmacy.

## 2017-08-13 ENCOUNTER — Other Ambulatory Visit: Payer: Self-pay

## 2017-08-13 DIAGNOSIS — R062 Wheezing: Secondary | ICD-10-CM

## 2017-08-13 DIAGNOSIS — J4520 Mild intermittent asthma, uncomplicated: Secondary | ICD-10-CM

## 2017-08-13 MED ORDER — FLUTICASONE PROPIONATE 50 MCG/ACT NA SUSP: NASAL | 0 refills | Status: DC

## 2017-09-09 ENCOUNTER — Other Ambulatory Visit: Payer: Self-pay | Admitting: Urgent Care

## 2017-09-09 DIAGNOSIS — R062 Wheezing: Secondary | ICD-10-CM

## 2017-09-09 DIAGNOSIS — J4520 Mild intermittent asthma, uncomplicated: Secondary | ICD-10-CM

## 2017-10-29 ENCOUNTER — Ambulatory Visit: Payer: Self-pay | Admitting: *Deleted

## 2017-10-29 ENCOUNTER — Emergency Department (HOSPITAL_BASED_OUTPATIENT_CLINIC_OR_DEPARTMENT_OTHER): Payer: BLUE CROSS/BLUE SHIELD

## 2017-10-29 ENCOUNTER — Other Ambulatory Visit: Payer: Self-pay

## 2017-10-29 ENCOUNTER — Encounter (HOSPITAL_BASED_OUTPATIENT_CLINIC_OR_DEPARTMENT_OTHER): Payer: Self-pay

## 2017-10-29 ENCOUNTER — Emergency Department (HOSPITAL_BASED_OUTPATIENT_CLINIC_OR_DEPARTMENT_OTHER)
Admission: EM | Admit: 2017-10-29 | Discharge: 2017-10-29 | Disposition: A | Discharge status: Home / Self Care | Payer: BLUE CROSS/BLUE SHIELD | Attending: Emergency Medicine | Admitting: Emergency Medicine

## 2017-10-29 DIAGNOSIS — J45909 Unspecified asthma, uncomplicated: Secondary | ICD-10-CM | POA: Insufficient documentation

## 2017-10-29 DIAGNOSIS — R0789 Other chest pain: Secondary | ICD-10-CM | POA: Insufficient documentation

## 2017-10-29 DIAGNOSIS — Z79899 Other long term (current) drug therapy: Secondary | ICD-10-CM | POA: Insufficient documentation

## 2017-10-29 DIAGNOSIS — R0781 Pleurodynia: Secondary | ICD-10-CM | POA: Diagnosis not present

## 2017-10-29 DIAGNOSIS — J189 Pneumonia, unspecified organism: Secondary | ICD-10-CM | POA: Diagnosis not present

## 2017-10-29 DIAGNOSIS — J181 Lobar pneumonia, unspecified organism: Secondary | ICD-10-CM | POA: Insufficient documentation

## 2017-10-29 DIAGNOSIS — R000 Tachycardia, unspecified: Secondary | ICD-10-CM | POA: Diagnosis not present

## 2017-10-29 DIAGNOSIS — R079 Chest pain, unspecified: Secondary | ICD-10-CM | POA: Diagnosis not present

## 2017-10-29 LAB — CBC
HCT: 41.8 % (ref 39.0–52.0)
Hemoglobin: 14.4 g/dL (ref 13.0–17.0)
MCH: 30.8 pg (ref 26.0–34.0)
MCHC: 34.4 g/dL (ref 30.0–36.0)
MCV: 89.5 fL (ref 78.0–100.0)
Platelets: 229 10*3/uL (ref 150–400)
RBC: 4.67 MIL/uL (ref 4.22–5.81)
RDW: 13.4 % (ref 11.5–15.5)
WBC: 8.4 10*3/uL (ref 4.0–10.5)

## 2017-10-29 LAB — BASIC METABOLIC PANEL
Anion gap: 10 (ref 5–15)
BUN: 13 mg/dL (ref 6–20)
CO2: 25 mmol/L (ref 22–32)
Calcium: 8.7 mg/dL — ABNORMAL LOW (ref 8.9–10.3)
Chloride: 104 mmol/L (ref 98–111)
Creatinine, Ser: 0.91 mg/dL (ref 0.61–1.24)
GFR calc Af Amer: >60 mL/min (ref >60)
GFR calc non Af Amer: >60 mL/min (ref >60)
Glucose, Bld: 86 mg/dL (ref 70–99)
Potassium: 4 mmol/L (ref 3.5–5.1)
Sodium: 139 mmol/L (ref 135–145)

## 2017-10-29 LAB — TROPONIN I: Troponin I: <0.03 ng/mL (ref <0.03)

## 2017-10-29 MED ORDER — SODIUM CHLORIDE 0.9 % IV SOLN
1.0000 g | Freq: Once | INTRAVENOUS | Status: AC
  Administered 2017-10-29: 1 g via INTRAVENOUS
  Filled 2017-10-29: qty 10

## 2017-10-29 MED ORDER — AZITHROMYCIN 250 MG PO TABS: 250.0000 mg | ORAL_TABLET | Freq: Every day | ORAL | 0 refills | Status: DC

## 2017-10-29 MED ORDER — SODIUM CHLORIDE 0.9 % IV SOLN
INTRAVENOUS | Status: DC | PRN
  Administered 2017-10-29: 500 mL via INTRAVENOUS

## 2017-10-29 MED ORDER — SODIUM CHLORIDE 0.9 % IV BOLUS
1000.0000 mL | Freq: Once | INTRAVENOUS | Status: AC
  Administered 2017-10-29: 1000 mL via INTRAVENOUS

## 2017-10-29 MED ORDER — AZITHROMYCIN 250 MG PO TABS
500.0000 mg | ORAL_TABLET | Freq: Once | ORAL | Status: AC
  Administered 2017-10-29: 500 mg via ORAL
  Filled 2017-10-29: qty 2

## 2017-10-29 NOTE — Telephone Encounter (Signed)
He called in while driving home from a trip.   "I'm about 45 minutes out".   C/O having chest pain on the left side that started last night around 10:30.    It's been constant.    He has a history of pneumonia and pleurisy.    This feels different but I'm not sure if it's that again.  He wanted an appt at Primary [facility] at [facility] but I let him know he needs to go to the ED.    He really wanted to come in so I checked with the flow coordinator at [facility] and they don't have any availability today plus she also said they would send him to the ED if he were to come here having chest pain.  I let the pt know this.   I asked him how far he was from town at this point and he said about 30 minutes.   He asked about the ED at [facility].   I let him know that is was fine to go to the ED at [facility].    I instructed him to go straight there.   If his symptoms started becoming worse to please pull over and call 911.   He verbalized understanding and was agreeable to these plans.   "I really think I'm fine until I get there".  I routed this to Primary Care at [facility] since he has been seen there in the past.  Reason for Disposition . [1] Chest pain lasts > 5 minutes AND [2] age > 67 . Taking a deep breath makes pain worse  Answer Assessment - Initial Assessment Questions 1. LOCATION: "Where does it hurt?"       Mainly on left side is the worst.   It goes from bottom of rib cage to middle and up the left side.   It's really tight when I try to take a breath.   I'm on my way back to town.  Be back in about an hour.   He's driving.    I've had pneumonia with pleursy before.     Last night I had a cough but not much this morning. 2. RADIATION: "Does the pain go anywhere else?" (e.g., into neck, jaw, arms, back)     No 3. ONSET: "When did the chest pain begin?" (Minutes, hours or days)      Last night 10:30 4. PATTERN "Does the pain come and go, or has it been  constant since it started?"  "Does it get worse with exertion?"      The pain has been constant.   It's better when I up and around but while I'm sitting here in the car it's really noticeable. 5. DURATION: "How long does it last" (e.g., seconds, minutes, hours)     constant 6. SEVERITY: "How bad is the pain?"  (e.g., Scale 1-10; mild, moderate, or severe)    - MILD (1-3): doesn't interfere with normal activities     - MODERATE (4-7): interferes with normal activities or awakens from sleep    - SEVERE (8-10): excruciating pain, unable to do any normal activities       It's painful with a deep breath.   My wife noticed I was wheezing the other night.    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 7. CARDIAC RISK FACTORS: "Do you have any history of heart problems or risk factors for heart disease?" (e.g., prior heart  attack, angina; high blood pressure, diabetes, being overweight, high cholesterol, smoking, or strong family history of heart di    There is a cardiac history in family but not me.   No diabetes.   Not a smoker.  8. PULMONARY RISK FACTORS: "Do you have any history of lung disease?"  (e.g., blood clots in lung, asthma, emphysema, birth control pills)     I've had asthma.    9. CAUSE: "What do you think is causing the chest pain?"     With my history of pneumonia and  pleursey I think of it.   I had a bad taste in my mouth last time it happened but is not there now. 10. OTHER SYMPTOMS: "Do you have any other symptoms?" (e.g., dizziness, nausea, vomiting, sweating, fever, difficulty breathing, cough)       No dizziness or nausea.   I do a hour cardio every day and ride a stationary bike.   11. PREGNANCY: "Is there any chance you are pregnant?" "When was your last menstrual period?"       N/A  Protocols used: CHEST PAIN-A-AH

## 2017-10-29 NOTE — Discharge Instructions (Addendum)
Please take all of your antibiotics until finished!   You may develop abdominal discomfort or diarrhea from the antibiotic.  You may help offset this with probiotics which you can buy or get in yogurt. Do not eat  or take the probiotics until 2 hours after your antibiotic.  You can take 500 to 1000 mg of Tylenol every 6 hours as needed for pain.  Do not exceed 4000 mg of Tylenol daily.  Drink plenty of fluids and get plenty of rest.  Follow-up with your primary care physician for reevaluation of your symptoms.  You should get a repeat chest x-ray in 3 weeks to ensure your pneumonia resolves.  You may also want to follow-up with a cardiologist for general work-up at some point.  Return to the emergency department if any concerning signs or symptoms develop such as worsening chest pain, shortness of breath, fevers, or passing out.

## 2017-10-29 NOTE — ED Notes (Signed)
Pt verbalizes understanding of d/c instructions and denies any further need at this time. 

## 2017-10-29 NOTE — ED Triage Notes (Signed)
C/o left side CP started last night-states feels like when he has had PNA and pleurisy in the past-mild cough-no fever-NAD-steady gait

## 2017-10-29 NOTE — ED Provider Notes (Signed)
[facility] DE[facility] Provider Note   CSN: [id number] Arrival date & time: 10/29/17  1336     History   Chief Complaint Chief Complaint  Patient presents with  . Chest Pain    HPI Brian Morrison is a 52 y.o. male with history of allergic rhinitis, anxiety, asthma, and depression presents for evaluation of acute onset, constant chest pain since yesterday.  Patient notes sharp pains to the left lateral inferior side of the chest.  Pain occurs when breathing only.  He does note that he developed a cough yesterday as well which is nonproductive.  Pain does not radiate.  Worsens with sitting upright, improves with walking. Pain is not exertional. States this feels similar to pneumonia he has experienced in the past.  He notes subjective chills but when he took his temperature yesterday he was afebrile.  Denies shortness of breath, nausea, vomiting, diaphoresis, lightheadedness, syncope, or abdominal pain.  No recent travel or surgeries, no hemoptysis, no prior history of DVT or PE, no leg swelling, and he is not on testosterone hormone replacement therapy.  He called his PCP earlier today and staff advised him to present to the ED for further evaluation.  He took an aspirin earlier today with mild relief of his symptoms.  He is a non-smoker, denies recreational drug use, excessive alcohol intake, or excessive caffeine intake.  The history is provided by the patient.    Past Medical History:  Diagnosis Date  . Allergic rhinitis   . Anxiety   . Asthma   . Depression     Patient Active Problem List   Diagnosis Date Noted  . Asthma with exacerbation 07/18/2015  . Allergic rhinitis 07/18/2015  . Depression with anxiety 12/04/2011    History reviewed. No pertinent surgical history.      Home Medications    Prior to Admission medications   Medication Sig Start Date End Date Taking? Authorizing Provider  azithromycin (ZITHROMAX) 250 MG tablet Take 1 tablet (250  mg total) by mouth daily. Take first 2 tablets together, then 1 every day until finished. 10/29/17   Nils Flack, Lavaya Defreitas A, PA-C  benzonatate (TESSALON) 100 MG capsule Take 1-2 capsules (100-200 mg total) 3 (three) times daily as needed by mouth. 01/13/17   Jaynee Eagles, PA-C  fluticasone [facility]) 50 MCG/ACT nasal spray SHAKE LIQUID AND USE 2 SPRAYS IN [facility] NOSTRIL EVERY DAY 09/09/17   Jaynee Eagles, PA-C  HYDROcodone-homatropine [facility]) 5-1.5 MG/5ML syrup Take 5 mLs at bedtime as needed by mouth. 01/13/17   Jaynee Eagles, PA-C  montelukast (SINGULAIR) 10 MG tablet TAKE 1 TABLET(10 MG) BY MOUTH AT BEDTIME 06/16/17   Ivar Drape D, PA  PROVENTIL HFA 108 (90 Base) MCG/ACT inhaler INHALE 2 PUFFS INTO THE LUNGS EVERY 6 HOURS AS NEEDED FOR WHEEZING OR SHORTNESS OF BREATH 12/13/15   Gale Journey, Damaris Hippo, PA-C  pseudoephedrine (SUDAFED 12 HOUR) 120 MG 12 hr tablet Take 1 tablet (120 mg total) 2 (two) times daily by mouth. 01/13/17   Jaynee Eagles, PA-C    Family History Family History  Problem Relation Age of Onset  . COPD Mother   . Lung cancer Maternal Grandmother   . Alzheimer's disease Maternal Grandfather   . Heart disease Paternal Grandfather     Social History Social History   Tobacco Use  . Smoking status: Former Smoker    Start date: 12/03/1997  . Smokeless tobacco: Never Used  Substance Use Topics  . Alcohol use: No    Alcohol/week: 0.0  standard drinks  . Drug use: No     Allergies   Patient has no known allergies.   Review of Systems Review of Systems  Constitutional: Positive for chills. Negative for diaphoresis and fever.  Respiratory: Positive for cough. Negative for shortness of breath.   Cardiovascular: Positive for chest pain. Negative for leg swelling.  Gastrointestinal: Negative for abdominal pain, diarrhea, nausea and vomiting.  Neurological: Negative for syncope and light-headedness.  All other systems reviewed and are negative.    Physical Exam Updated Vital Signs BP  123/84 (BP Location: Left Arm)   Pulse (!) 58   Temp 98.3 F (36.8 C) (Oral)   Resp 16   Ht 6\' 1"  (1.854 m)   Wt 85.7 kg   SpO2 100%   BMI 24.94 kg/m   Physical Exam  Constitutional: He appears well-developed and well-nourished. No distress.  Resting comfortably in bed  HENT:  Head: Normocephalic and atraumatic.  Eyes: Conjunctivae are normal. Right eye exhibits no discharge. Left eye exhibits no discharge.  Neck: Normal range of motion. Neck supple. No JVD present. No tracheal deviation present.  Cardiovascular: Normal rate and regular rhythm.  Pulses:      Carotid pulses are 2+ on the right side, and 2+ on the left side.      Radial pulses are 2+ on the right side, and 2+ on the left side.       Dorsalis pedis pulses are 2+ on the right side, and 2+ on the left side.       Posterior tibial pulses are 2+ on the right side, and 2+ on the left side.  Homans sign absent bilaterally, no lower extremity edema, no palpable cords, compartments are soft   Pulmonary/Chest: Effort normal and breath sounds normal.  Equal rise and fall of chest, no increased work of breathing.  Speaking in full sentences without difficulty.  No tenderness to palpation of the chest wall.  Abdominal: Soft. Bowel sounds are normal. He exhibits no distension. There is no tenderness.  Musculoskeletal: He exhibits no edema.       Right lower leg: Normal. He exhibits no tenderness and no edema.       Left lower leg: Normal. He exhibits no tenderness and no edema.  Neurological: He is alert.  Skin: Skin is warm and dry. No erythema.  Psychiatric: He has a normal mood and affect. His behavior is normal.  Nursing note and vitals reviewed.    ED Treatments / Results  Labs (all labs ordered are listed, but only abnormal results are displayed) Labs Reviewed  BASIC METABOLIC PANEL - Abnormal; Notable for the following components:      Result Value   Calcium 8.7 (*)    All other components within normal limits    CBC  TROPONIN I    EKG EKG Interpretation  Date/Time:  Wednesday October 29 2017 13:45:54 EDT Ventricular Rate:  59 PR Interval:  170 QRS Duration: 90 QT Interval:  392 QTC Calculation: 388 R Axis:   76 Text Interpretation:  Sinus bradycardia Otherwise normal ECG No old tracing to compare Confirmed by Deno Etienne [phone]) on 10/29/2017 2:47:20 PM   Radiology Dg Chest 2 View  Result Date: 10/29/2017 CLINICAL DATA:  Chest pain in the left breast region since last night. History of asthma, former smoker. EXAM: CHEST - 2 VIEW COMPARISON:  PA and lateral chest x-ray of January 13, 2017 FINDINGS: The lungs are well-expanded. There is hazy increased density in the left infrahilar  region more conspicuous than in the past. There is a small amount of pleural fluid blunting the posterior costophrenic angle on the left. The heart and pulmonary vascularity are normal. The mediastinum is normal in width. The bony thorax exhibits no acute abnormality. IMPRESSION: Subtle increased density in the left lower lung likely in the lower lobe with small left pleural effusion. This may reflect pneumonia. Followup PA and lateral chest X-ray is recommended in 3-4 weeks following trial of antibiotic therapy to ensure resolution and exclude underlying malignancy. Electronically Signed   By: David  Martinique M.D.   On: 10/29/2017 14:02    Procedures Procedures (including critical care time)  Medications Ordered in ED Medications  sodium chloride 0.9 % bolus 1,000 mL (1,000 mLs Intravenous New Bag/Given 10/29/17 1500)  0.9 %  sodium chloride infusion (500 mLs Intravenous New Bag/Given 10/29/17 1503)  cefTRIAXone (ROCEPHIN) 1 g in sodium chloride 0.9 % 100 mL IVPB (0 g Intravenous Stopped 10/29/17 1557)  azithromycin (ZITHROMAX) tablet 500 mg (500 mg Oral Given 10/29/17 1501)     Initial Impression / Assessment and Plan / ED Course  I have reviewed the triage vital signs and the nursing notes.  Pertinent labs &  imaging results that were available during my care of the patient were reviewed by me and considered in my medical decision making (see chart for details).     Patient with pleuritic focal left-sided chest pain since yesterday.  He is afebrile, vital signs are stable.  He is nontoxic in appearance.  Associated symptom includes cough.  Pain is not reproducible on palpation.  His EKG shows no ischemic changes, no arrhythmia.  Troponin is negative. Patient with HEART score of 1, very low risk for cardiac disease. Troponin ordered in triage, I do not think his symptoms are cardiac in nature and do not think serial troponin is warranted. Symptoms have been constant since last night.  I doubt ACS/MI.  Doubt PE in the absence of shortness of breath, tachycardia, or hypoxia.  His chest x-ray is consistent with left lower lobe pneumonia with small pleural effusion.  States his symptoms are consistent with prior bouts of pneumonia in the past.  CURB-65 score of 0, PSI/port score of 60, both indicating reasonable for outpatient management.  He is well-appearing, nontoxic and nonseptic, no increased work of breathing noted.  No evidence of pericarditis, myocarditis, dissection, or cardiac tamponade.  Will discharge with Z-Pak, recommend follow-up with PCP for reevaluation of symptoms.  Discussed strict ED return precautions.  Patient and patient's wife verbalized understanding of and agreement with plan and patient is stable for discharge home at this time.  Final Clinical Impressions(s) / ED Diagnoses   Final diagnoses:  Community acquired pneumonia of left lower lobe of lung [facility])  Pleuritic chest pain    ED Discharge Orders         Ordered    azithromycin (ZITHROMAX) 250 MG tablet  Daily     10/29/17 [place], [place] A, PA-C 10/29/17 [place], [place], DO 10/30/17 [id number]

## 2017-10-30 ENCOUNTER — Telehealth: Payer: Self-pay | Admitting: Urgent Care

## 2017-10-30 NOTE — Telephone Encounter (Signed)
Copied from [place]. Topic: Quick Communication - See Telephone Encounter >> Oct 30, 2017 12:24 PM Sheran Luz wrote: CRM for notification. See Telephone encounter for: 10/30/17.  Pt called stating that he was discharged from the ER last night and is in a lot of pain still. Pt stated he was advised by ER to take otc medication as needed but pt states that nothing is working and he is requesting something called in to pharm to help him sleep/deal with pain. Pt declined an appointment. Please advise.   [place], [place] AT [place] [phone] (Phone) [phone] (Fax)

## 2017-10-31 ENCOUNTER — Encounter: Payer: Self-pay | Admitting: Physician Assistant

## 2017-10-31 ENCOUNTER — Other Ambulatory Visit: Payer: Self-pay

## 2017-10-31 ENCOUNTER — Ambulatory Visit (HOSPITAL_COMMUNITY)
Admission: RE | Admit: 2017-10-31 | Discharge: 2017-10-31 | Disposition: A | Discharge status: Home / Self Care | Payer: BLUE CROSS/BLUE SHIELD | Source: Ambulatory Visit | Attending: Physician Assistant | Admitting: Physician Assistant

## 2017-10-31 ENCOUNTER — Ambulatory Visit: Payer: BLUE CROSS/BLUE SHIELD | Admitting: Physician Assistant

## 2017-10-31 VITALS — BP 110/84 | HR 59 | Temp 98.4°F | Resp 16 | Ht 73.0 in | Wt 185.6 lb

## 2017-10-31 DIAGNOSIS — J90 Pleural effusion, not elsewhere classified: Secondary | ICD-10-CM | POA: Insufficient documentation

## 2017-10-31 DIAGNOSIS — R0602 Shortness of breath: Secondary | ICD-10-CM

## 2017-10-31 DIAGNOSIS — J189 Pneumonia, unspecified organism: Secondary | ICD-10-CM | POA: Insufficient documentation

## 2017-10-31 DIAGNOSIS — R0789 Other chest pain: Secondary | ICD-10-CM | POA: Insufficient documentation

## 2017-10-31 MED ORDER — MELOXICAM 7.5 MG PO TABS: 7.5000 mg | ORAL_TABLET | Freq: Every day | ORAL | 0 refills | Status: DC

## 2017-10-31 MED ORDER — IOPAMIDOL (ISOVUE-370) INJECTION 76%
INTRAVENOUS | Status: AC
  Administered 2017-10-31: 100 mL via INTRAVENOUS
  Filled 2017-10-31: qty 100

## 2017-10-31 NOTE — Progress Notes (Signed)
MRN: [id number] DOB: [birth year]  Subjective:   Brian Morrison is a 52 y.o. male presenting for chief complaint of Chest Pain (unable to sleep due to pneumonia dx on 10/29/17, per pt his chest hurts right now 6/10 and last 9/10 the worst its been.  Told to take ibuprofen for the pain but is unable to due to it causing him to h) . Dx with pneumonia on 10/29/17 after being seen in ED for chest pain. Neg troponin and normal CBC. Plain film with subtle increased density in the left lower lung likely in the lower lobe with small left pleural effusion. Tx with azithromycin. Aleve and tylenol which does help a little bit but it is hard to sleep due pain.   Since ED visit, has not felt much better. His chest pain has actually worsened. Pain is pleuritic and feels sharp/stabbing in nature. It does not radiate. Has new associated SOB, worse with ambulating. Denies N/V/D, palpitation, lower leg swelling, diaphoresis, cough, and ever. Has hx of asthma, not on any current medication. No PMH COPD. Denies smoking. He exercises regularly. The day prior to ED visit, he did spend 4 hours in the car traveling, did stop for breaks occasionally.  No recent surgery/immobilization, hx of cancer, leg swelling, hemoptysis, prior DVT/PE, or hormone use. No PMH of heart disease, MI, HTN, diabetes, HLD. FH of MI in PGF in 61s.     Review of Systems  Constitutional: Positive for chills and malaise/fatigue. Negative for weight loss.  Cardiovascular: Negative for leg swelling.  Gastrointestinal: Negative for abdominal pain, diarrhea and vomiting.  Neurological: Negative for dizziness, weakness and headaches.    [place] has a current medication list which includes the following prescription(s): azithromycin, fluticasone, montelukast, proventil hfa, benzonatate, hydrocodone-homatropine, meloxicam, and pseudoephedrine. Also has No Known Allergies.  Brian Morrison  has a past medical history of Allergic rhinitis, Anxiety, Asthma,  and Depression. Also  has no past surgical history on file.   Objective:   Vitals: BP 110/84 (BP Location: Left Arm, Patient Position: Sitting, Cuff Size: Large)   Pulse (!) 59   Temp 98.4 F (36.9 C) (Oral)   Resp 16   Ht 6\' 1"  (1.854 m)   Wt 185 lb 9.6 oz (84.2 kg)   SpO2 98%   BMI 24.49 kg/m   Physical Exam  Constitutional: He is oriented to person, place, and time. He appears well-developed and well-nourished. No distress.  HENT:  Head: Normocephalic and atraumatic.  Mouth/Throat: Uvula is midline, oropharynx is clear and moist and mucous membranes are normal. No tonsillar exudate.  Eyes: Pupils are equal, round, and reactive to light. Conjunctivae and EOM are normal.  Neck: Normal range of motion.  Cardiovascular: Normal rate, regular rhythm, normal heart sounds and intact distal pulses.  Pulmonary/Chest: Effort normal and breath sounds normal. He has no decreased breath sounds. He has no wheezes. He has no rhonchi. He has no rales. He exhibits no tenderness, no bony tenderness, no crepitus, no edema and no swelling.  Musculoskeletal:       Right lower leg: He exhibits no swelling.       Left lower leg: He exhibits no swelling.  Neurological: He is alert and oriented to person, place, and time.  Skin: Skin is warm and dry.  Bilateral calf measurements are equal.  No erythema, warmth, or tenderness of bilateral lower extremities.  Psychiatric: He has a normal mood and affect.  Vitals reviewed.   No results found for this or any  previous visit (from the past 24 hour(s)).   Wells Criteria for Pulmonary Embolism  Clinical signs and symptoms of DVT: No (0 points) PE is #1 diagnosis, or equally likely: Yes (3 points) Heart rate over 100: No (0 points) Immobilization at least 3 days, or surgery in past 4 weeks: No (0 points) Previous, objectively diagnosed PE or DVT: No (0 points) Hemoptysis: No (0 points) Malignancy w/ treatment within 6 mo, or palliative: No (0  points)  3 Points. Mod risk group: 16.2% chance PE [facility]). Or PE unlikely, 3% incidence Brian Morrison).  EKG shows sinus bradycardia with rate of 59 bpm. No acute ST or T wave abnormalities. PR and QRS intervals within normal limits. No acute changes noted from prior EKG of 10/29/17. Findings presented and discussed with Dr. Mitchel Honour.    Assessment and Plan :  1. Pneumonia due to infectious organism, unspecified laterality, unspecified part of lung 2. Other chest pain 3. Shortness of breath This case was precepted with Dr. Mitchel Honour.  Patient was recently seen in the ED and diagnosed with a left lobe pneumonia.  Has been treated with azithromycin, which he is taking appropriately.  ED visit 2 days ago, he had worsening left-sided chest pain, most notable with taking a deep breath.  Also has new onset shortness of breath.  Does not exhibit any signs of illness such as cough, fever, or chills. Pain is not reproducible on exam.  Chest x-ray in the ED showed subtle increased density of the left lower lobe with small left pleural effusion, reporting that it may reflect a pneumonia but cannot exclude malignancy until follow-up x-ray in 3 to 4 weeks.  However with presenting sx, hx of recent travel for 4 hours in the car prior to this event, and questionable pneumonia versus malignancy on chest x-ray, agree that CTA is recommended to rule out PE.  Patient's vitals are stable.  He is not tachycardic.  SPO2 98%.  EKG with sinus bradycardia at 59 bpm.  Patient stable to transfer himself to get stat CTA.  Will contact patient with results and discuss further treatment plan. - EKG 12-Lead - CT Angio Chest W/Cm &/Or Wo Cm; Future  Tenna Delaine, PA-C  [facility] at [facility] 11/06/2017 1:54 PM

## 2017-10-31 NOTE — Patient Instructions (Addendum)
  Please go to the main entrance of [facility] and tell them you are there for a CT.  We will contact you with the results to discuss further treatment plan.   If you have lab work done today you will be contacted with your lab results within the next 2 weeks.  If you have not heard from [place] then please contact us. The fastest way to get your results is to register for My Chart.   IF you received an x-ray today, you will receive an invoice from [facility]. Please contact [facility] at [phone] with questions or concerns regarding your invoice.   IF you received labwork today, you will receive an invoice from [facility]. Please contact [facility] at [phone] with questions or concerns regarding your invoice.   Our billing staff will not be able to assist you with questions regarding bills from these companies.  You will be contacted with the lab results as soon as they are available. The fastest way to get your results is to activate your My Chart account. Instructions are located on the last page of this paperwork. If you have not heard from [place] regarding the results in 2 weeks, please contact this office.

## 2017-10-31 NOTE — Telephone Encounter (Signed)
Pt has been seen for f/u.

## 2017-11-02 ENCOUNTER — Telehealth: Payer: Self-pay

## 2017-11-02 NOTE — Telephone Encounter (Signed)
Copied from [place] [id number]. Topic: General - Other >> Oct 31, 2017  5:53 PM Yvette Rack wrote: Reason for CRM: pt calling to get results of Ct stating that he was going to get some pain medicine depends on the results please call pt back at [phone] pt states that he is in pain and need the medicine that's the reason why he came into office  Message sent to [facility]

## 2017-11-03 NOTE — Telephone Encounter (Signed)
Please call pt, medication was sent to pharmacy 10/31/17. If he is not getting improvement with that medication, please let me know.

## 2017-11-06 ENCOUNTER — Encounter: Payer: Self-pay | Admitting: Physician Assistant

## 2017-11-06 NOTE — Telephone Encounter (Signed)
Spoke with pt.  He got rx Meloxicam & feels better.  Advised to take with food and plenty of fluids Pt verbalized understanding.

## 2017-11-07 ENCOUNTER — Other Ambulatory Visit: Payer: Self-pay | Admitting: Urgent Care

## 2017-11-07 DIAGNOSIS — J4520 Mild intermittent asthma, uncomplicated: Secondary | ICD-10-CM

## 2017-11-07 DIAGNOSIS — R062 Wheezing: Secondary | ICD-10-CM

## 2017-11-07 NOTE — Telephone Encounter (Signed)
Flonase refill Last Refill:16G RF x1 on 09/09/17 Last OV: 01/13/17 PCP: [place]: Festus Barren #[id number]  Non compliant with follow up.

## 2017-11-26 ENCOUNTER — Telehealth: Payer: Self-pay | Admitting: Physician Assistant

## 2017-11-26 NOTE — Telephone Encounter (Signed)
I called pt and left vm for pt to call and  schedule  an appointment to be seen.  That a  chest x-ray can be order by the provider that day of his appointment because he will need to see a provider and the  provider to get the results and let him know what they and if any treatment is needed.

## 2017-11-26 NOTE — Telephone Encounter (Signed)
Copied from [place] [phone]. Topic: General - Other >> Nov 26, 2017 12:49 PM Keene Breath wrote: Reason for CRM: Patient called to request an additional chest xray per Dr. Timmothy Euler from his 10/31/17 CT chest imaging.  Patient would like a call back to verify that an order has been placed for the xray.  Please advise.  CB# [phone]

## 2017-11-27 NOTE — Telephone Encounter (Signed)
Pt would like to know if he has to see Dr. Timmothy Euler. Pt states that he would like to have this done as soon as possible. Pt is requesting call back at [phone]. Please advise.

## 2017-11-27 NOTE — Telephone Encounter (Signed)
Spoke with pt.  He is anxious to get repeat CXR - 4 week time frame is before [place] returns.  Pt req appt. Given appt with Dr. Carlota Raspberry for tomorrow Ludwig Clarks 9/27.

## 2017-11-28 ENCOUNTER — Encounter: Payer: Self-pay | Admitting: Family Medicine

## 2017-11-28 ENCOUNTER — Ambulatory Visit (INDEPENDENT_AMBULATORY_CARE_PROVIDER_SITE_OTHER): Payer: BLUE CROSS/BLUE SHIELD | Admitting: Family Medicine

## 2017-11-28 ENCOUNTER — Other Ambulatory Visit: Payer: Self-pay

## 2017-11-28 VITALS — BP 132/72 | HR 60 | Temp 98.6°F | Ht 73.0 in | Wt 183.8 lb

## 2017-11-28 DIAGNOSIS — Z8701 Personal history of pneumonia (recurrent): Secondary | ICD-10-CM

## 2017-11-28 NOTE — Progress Notes (Signed)
Presented for follow-up chest x-ray after treatment of community-acquired pneumonia.  Seen initially through [place].  Initial left-sided chest pain, pleuritic pain, cough.  Chest x-ray indicated left lower lobe pneumonia with small pleural effusion.  Treated with azithromycin.   Seen in follow-up on August 30th here, with Tenna Delaine, [facility].  Persistent symptoms at that time with possible worsening chest pain.  Was reporting some dyspnea worse with ambulation.  Due to persistent symptoms and previous car travel, CT angiogram was obtained:  IMPRESSION: Lingular pneumonia with associated parapneumonic effusion. Reactive mediastinal lymph nodes.  Started on Mobic, plan for repeat CXR in 4 weeks.   Patient left due to wait.  Called after clinic to check on status, and try to arrange for CXR without OV if clinically improved.   Mobile no answer, unable to leave VM.  Left VM on home phone - will try to reach again tomorrow.

## 2017-11-28 NOTE — Patient Instructions (Signed)
° ° ° °  If you have lab work done today you will be contacted with your lab results within the next 2 weeks.  If you have not heard from us then please contact us. The fastest way to get your results is to register for My Chart. ° ° °IF you received an x-ray today, you will receive an invoice from [facility]. Please contact [facility] at [phone] with questions or concerns regarding your invoice.  ° °IF you received labwork today, you will receive an invoice from [facility]. Please contact [facility] at [phone] with questions or concerns regarding your invoice.  ° °Our billing staff will not be able to assist you with questions regarding bills from these companies. ° °You will be contacted with the lab results as soon as they are available. The fastest way to get your results is to activate your My Chart account. Instructions are located on the last page of this paperwork. If you have not heard from us regarding the results in 2 weeks, please contact this office. °  ° ° ° °

## 2017-12-03 ENCOUNTER — Other Ambulatory Visit: Payer: Self-pay | Admitting: Urgent Care

## 2017-12-03 DIAGNOSIS — J4520 Mild intermittent asthma, uncomplicated: Secondary | ICD-10-CM

## 2017-12-03 DIAGNOSIS — R062 Wheezing: Secondary | ICD-10-CM

## 2017-12-04 ENCOUNTER — Ambulatory Visit (INDEPENDENT_AMBULATORY_CARE_PROVIDER_SITE_OTHER): Payer: BLUE CROSS/BLUE SHIELD

## 2017-12-04 ENCOUNTER — Encounter: Payer: Self-pay | Admitting: Physician Assistant

## 2017-12-04 ENCOUNTER — Ambulatory Visit: Payer: BLUE CROSS/BLUE SHIELD | Admitting: Physician Assistant

## 2017-12-04 ENCOUNTER — Other Ambulatory Visit: Payer: Self-pay

## 2017-12-04 VITALS — BP 126/77 | HR 64 | Temp 97.6°F | Resp 20 | Ht 73.7 in | Wt 183.4 lb

## 2017-12-04 DIAGNOSIS — Z8701 Personal history of pneumonia (recurrent): Secondary | ICD-10-CM

## 2017-12-04 DIAGNOSIS — J90 Pleural effusion, not elsewhere classified: Secondary | ICD-10-CM | POA: Diagnosis not present

## 2017-12-04 DIAGNOSIS — B392 Pulmonary histoplasmosis capsulati, unspecified: Secondary | ICD-10-CM | POA: Diagnosis not present

## 2017-12-04 NOTE — Progress Notes (Signed)
Brian Morrison  MRN: [id number] DOB: 1965-05-02  PCP: System, Pcp Not In  Subjective:  Pt is a 52 year old male who presents to clinic for f/u PNU.   Dx with pneumonia on 10/29/17 after being seen in ED for chest pain. He was here 8/30 and saw PA [place]. Chest x-ray showed increased density in the left lower lung with small left pleural effusion  but cannot exclude malignancy Tx with azithromycin. He was asked to f/u x-ray in 3 to 4 weeks.  Today he endorses improvement of symptoms. He is running, however cannot take as deep of breaths like he used to when exercising.   CT angio chest 8/30: Lingular pneumonia with associated parapneumonic effusion. Reactive mediastinal lymph nodes.   Review of Systems  Constitutional: Negative for chills, diaphoresis, fatigue and fever.  Respiratory: Negative for cough, chest tightness, shortness of breath and wheezing.   Cardiovascular: Negative for chest pain, palpitations and leg swelling.  Gastrointestinal: Negative for diarrhea, nausea and vomiting.  Neurological: Negative for dizziness, syncope, light-headedness and headaches.  Psychiatric/Behavioral: Negative for sleep disturbance. The patient is not nervous/anxious.     Patient Active Problem List   Diagnosis Date Noted  . Asthma with exacerbation 07/18/2015  . Allergic rhinitis 07/18/2015  . Depression with anxiety 12/04/2011    Current Outpatient Medications on File Prior to Visit  Medication Sig Dispense Refill  . fluticasone (FLONASE) 50 MCG/ACT nasal spray SHAKE LIQUID AND USE 2 SPRAYS IN EACH NOSTRIL EVERY DAY 16 g 0   No current facility-administered medications on file prior to visit.     No Known Allergies   Objective:  BP 126/77   Pulse 64   Temp 97.6 F (36.4 C) (Oral)   Resp 20   Ht 6' 1.7" (1.872 m)   Wt 183 lb 6.4 oz (83.2 kg)   SpO2 100%   BMI 23.74 kg/m   Physical Exam  Constitutional: He is oriented to person, place, and time. He appears  well-developed and well-nourished.  Cardiovascular: Normal rate and regular rhythm.  Pulmonary/Chest: Effort normal. No respiratory distress. He has no wheezes. He has no rhonchi. He has no rales.  Neurological: He is alert and oriented to person, place, and time.  Skin: Skin is warm and dry.  Psychiatric: He has a normal mood and affect. His behavior is normal. Judgment and thought content normal.  Vitals reviewed.  Dg Chest 2 View  Result Date: 12/04/2017 CLINICAL DATA:  Pneumonia. EXAM: CHEST - 2 VIEW COMPARISON:  Chest radiographs 10/29/2017 and CT 10/31/2017 FINDINGS: The cardiomediastinal silhouette is unchanged and within normal limits. The lungs are hyperinflated. Lingular opacity on the prior studies has resolved. There is likely a trace residual left pleural effusion. No new airspace consolidation or pneumothorax is identified. No acute osseous abnormality is seen. IMPRESSION: Resolved lingular pneumonia with trace residual left pleural effusion. Electronically Signed   By: Logan Bores M.D.   On: 12/04/2017 12:18    Assessment and Plan :  1. History of pneumonia - Pt presents for f/u Chest x-ray after dx PNU 8/28 and treated with azithromycin. Clinically he is improving. Vital signs are stable. Chest x-ray shows resolved lingular pneumonia. Advised deep breathing  - DG Chest 2 View; Future   Mercer Pod, PA-C  [facility] 12/04/2017 11:49 AM  Please note: Portions of this report may have been transcribed using dragon voice recognition software. Every effort was made to ensure accuracy; however, inadvertent  computerized transcription errors may be present.

## 2017-12-04 NOTE — Patient Instructions (Addendum)
Your pneumonia is resolved! X-ray results: Resolved lingular pneumonia with trace residual left pleural effusion -- I recommend working on deep breathing 2-3 times a day for 5-10 deep breaths. Breathe in for 4-5 seconds and purse your lips and blow about 6-10 seconds.     IF you received an x-ray today, you will receive an invoice from [facility]. Please contact [facility] at [phone] with questions or concerns regarding your invoice.   IF you received labwork today, you will receive an invoice from [place] [facility]. Please contact [facility] at [phone] with questions or concerns regarding your invoice.   Our billing staff will not be able to assist you with questions regarding bills from these companies.  You will be contacted with the lab results as soon as they are available. The fastest way to get your results is to activate your My Chart account. Instructions are located on the last page of this paperwork. If you have not heard from [place] regarding the results in 2 weeks, please contact this office.

## 2018-01-04 ENCOUNTER — Other Ambulatory Visit: Payer: Self-pay | Admitting: Physician Assistant

## 2018-01-04 DIAGNOSIS — R062 Wheezing: Secondary | ICD-10-CM

## 2018-01-04 DIAGNOSIS — J4520 Mild intermittent asthma, uncomplicated: Secondary | ICD-10-CM

## 2018-03-11 DIAGNOSIS — H524 Presbyopia: Secondary | ICD-10-CM | POA: Diagnosis not present

## 2018-03-11 DIAGNOSIS — H5213 Myopia, bilateral: Secondary | ICD-10-CM | POA: Diagnosis not present

## 2018-05-07 ENCOUNTER — Other Ambulatory Visit: Payer: Self-pay | Admitting: Physician Assistant

## 2018-05-07 DIAGNOSIS — R062 Wheezing: Secondary | ICD-10-CM

## 2018-05-07 DIAGNOSIS — J4520 Mild intermittent asthma, uncomplicated: Secondary | ICD-10-CM

## 2018-05-07 NOTE — Telephone Encounter (Signed)
Requested medication (s) are due for refill today:   Yes  Requested medication (s) are on the active medication list:   Yes  Future visit scheduled:   No   Last ordered: 4 months ago by [place].   Does not have a new provider.   Requested Prescriptions  Pending Prescriptions Disp Refills   fluticasone (FLONASE) 50 MCG/ACT nasal spray [Pharmacy Med Name: FLUTICASONE 50MCG NASAL SP (120) RX] 16 g 0    Sig: SHAKE LIQUID AND USE 2 SPRAYS IN EACH NOSTRIL EVERY DAY     Ear, Nose, and Throat: Nasal Preparations - Corticosteroids Passed - 05/07/2018  3:50 AM      Passed - Valid encounter within last 12 months    Recent Outpatient Visits          5 months ago History of pneumonia   [facility] at [facility], [place], PA-C   5 months ago History of pneumonia   [facility] at [place], [place]   6 months ago Pneumonia due to infectious organism, unspecified laterality, unspecified part of lung   [facility] at [place], [place], PA-C   1 year ago Cough   Primary Care at [place], [place]   1 year ago Heel spur, left   [facility] Care at [place], [place], [place]

## 2018-06-01 ENCOUNTER — Other Ambulatory Visit: Payer: Self-pay | Admitting: Family Medicine

## 2018-06-01 DIAGNOSIS — J4520 Mild intermittent asthma, uncomplicated: Secondary | ICD-10-CM

## 2018-06-01 DIAGNOSIS — R062 Wheezing: Secondary | ICD-10-CM

## 2018-07-08 ENCOUNTER — Other Ambulatory Visit: Payer: Self-pay | Admitting: Family Medicine

## 2018-07-08 DIAGNOSIS — R062 Wheezing: Secondary | ICD-10-CM

## 2018-07-08 DIAGNOSIS — J4520 Mild intermittent asthma, uncomplicated: Secondary | ICD-10-CM

## 2018-09-29 ENCOUNTER — Other Ambulatory Visit: Payer: Self-pay | Admitting: Family Medicine

## 2018-09-29 DIAGNOSIS — J4520 Mild intermittent asthma, uncomplicated: Secondary | ICD-10-CM

## 2018-09-29 DIAGNOSIS — R062 Wheezing: Secondary | ICD-10-CM

## 2019-01-05 IMAGING — DX DG CHEST 2V
2 series · 2 of 2 positions shown · non-contrast
Comparison: Chest radiographs 10/29/2017 and CT 10/31/2017

CLINICAL DATA: Pneumonia.

EXAM:
CHEST - 2 VIEW

[chest pa]
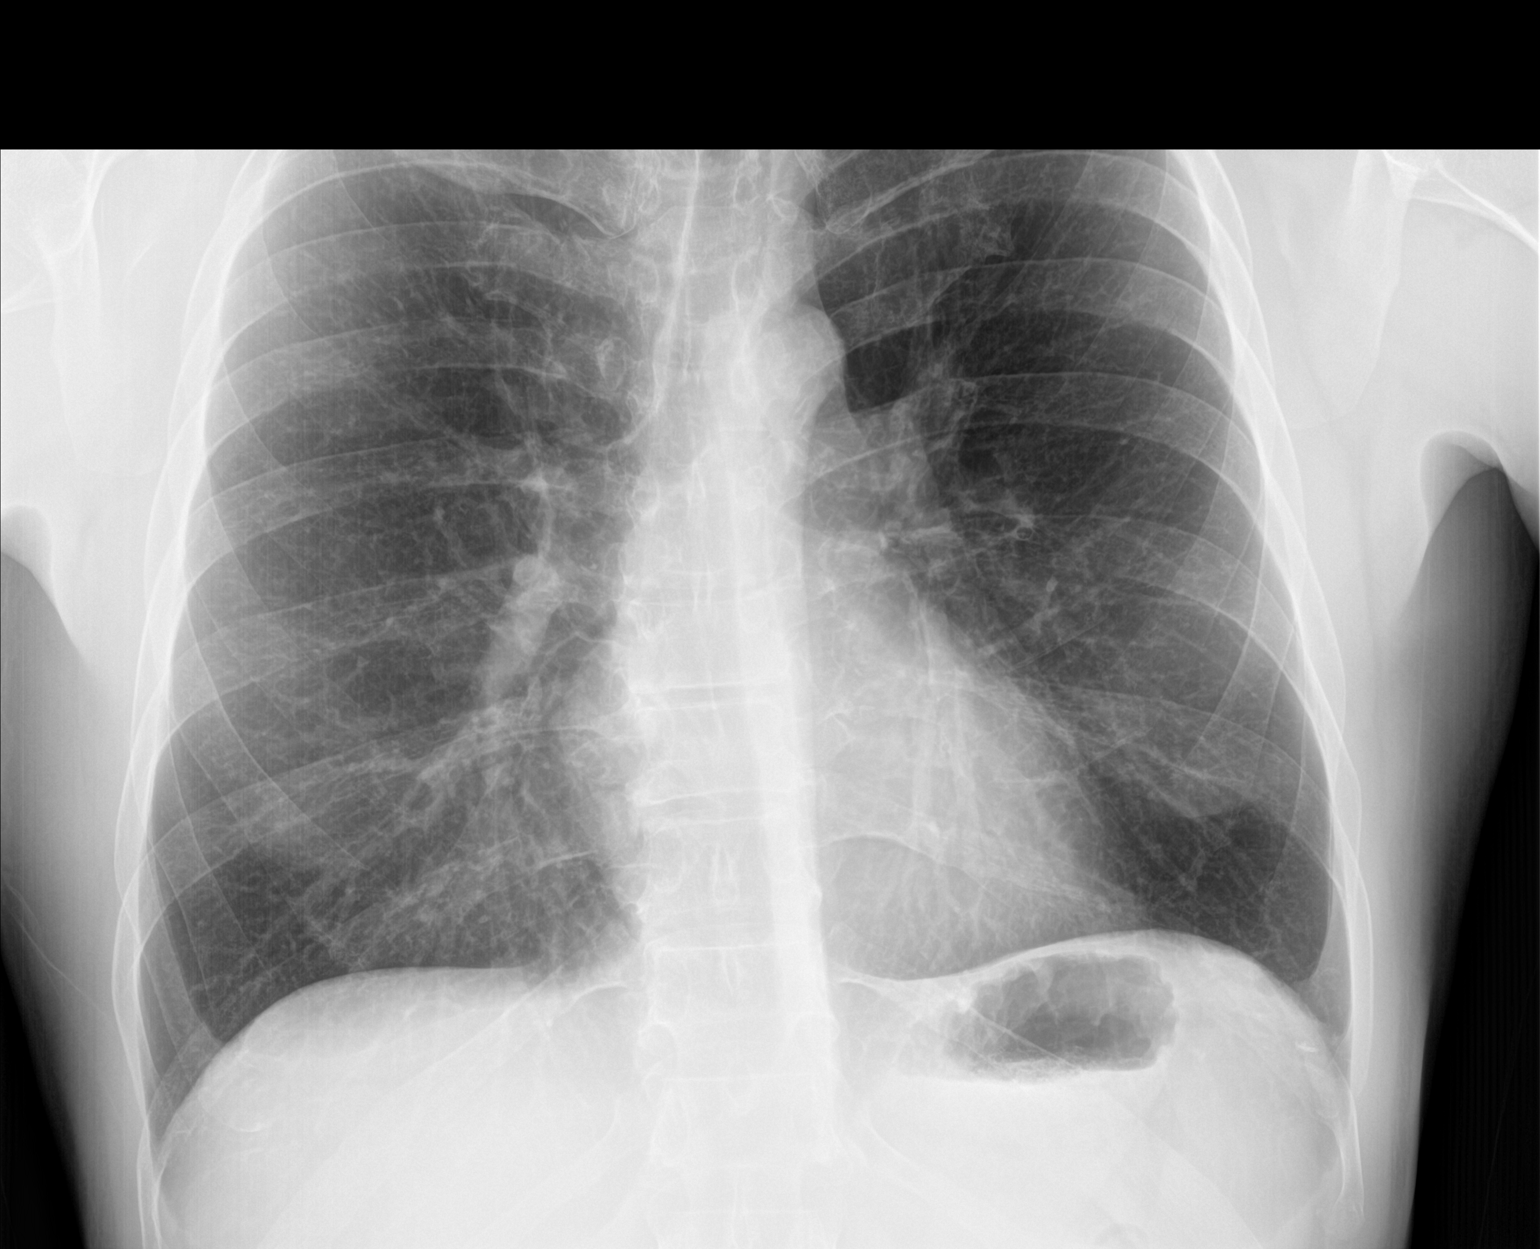

[chest lat]
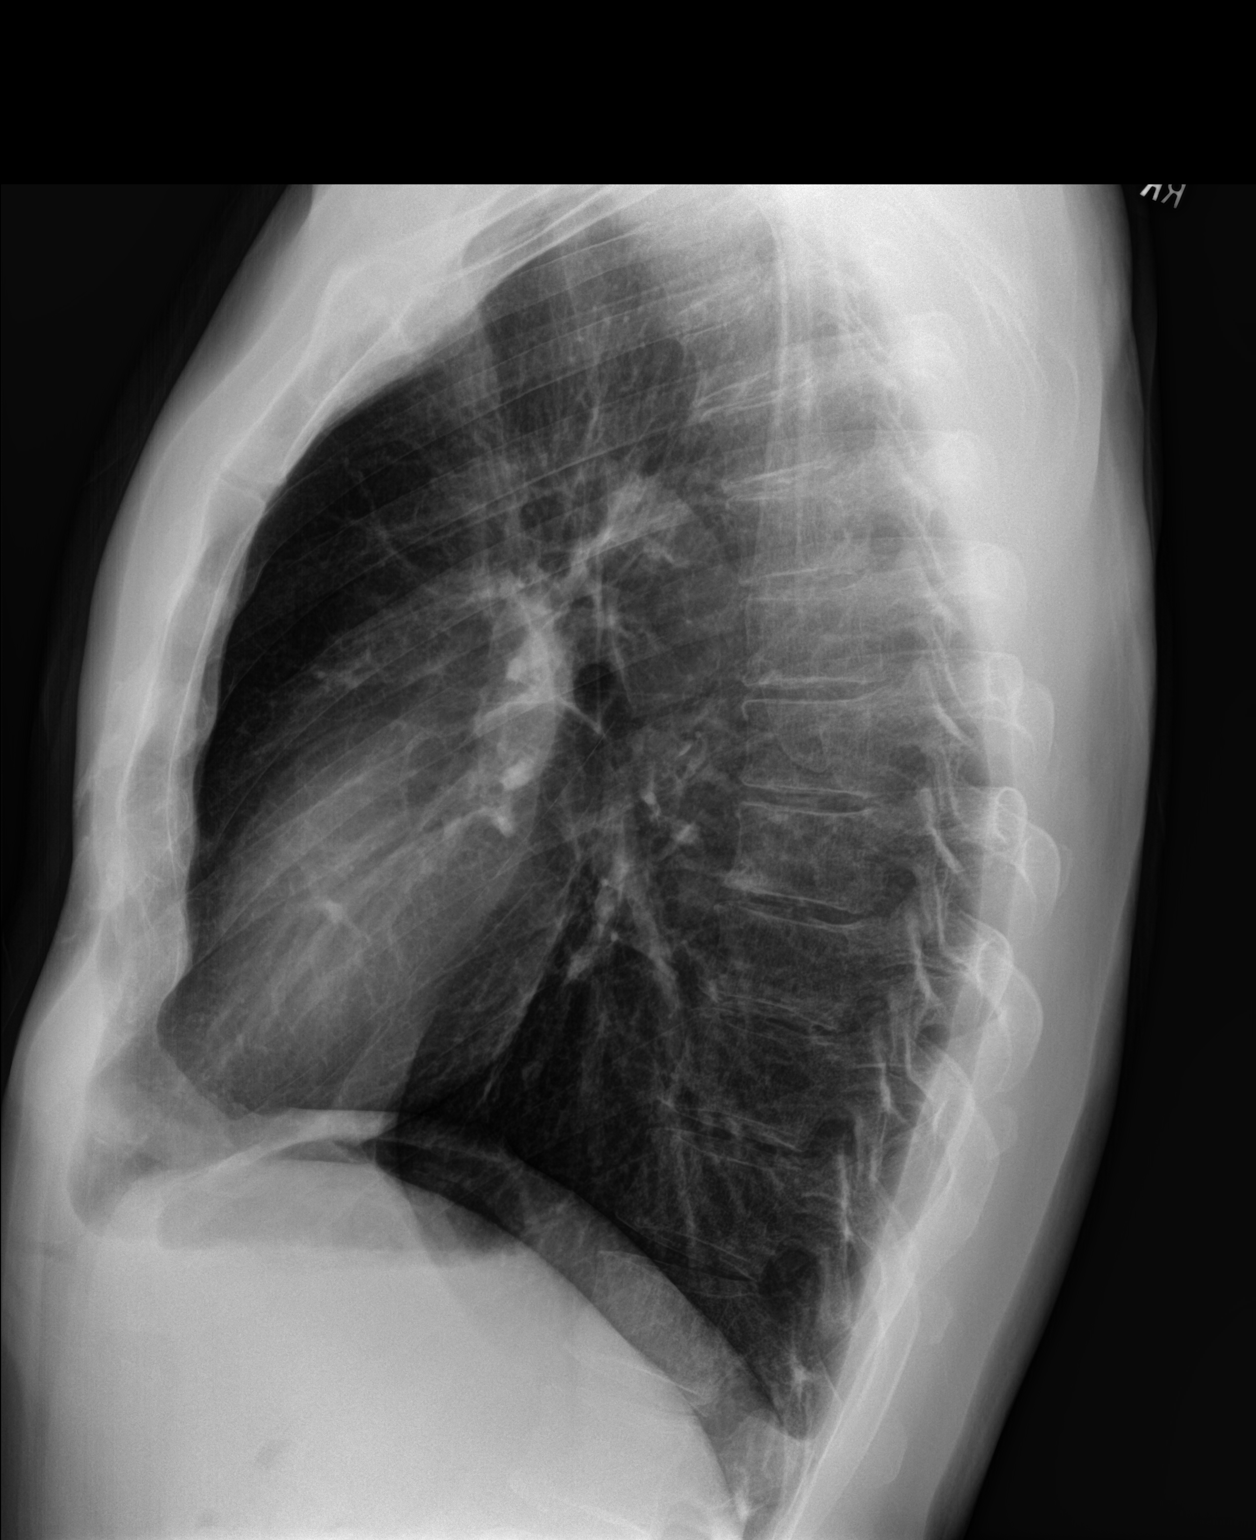

[2 of 2 positions shown; findings below may reference images not displayed]

FINDINGS: The cardiomediastinal silhouette is unchanged and within normal
limits. The lungs are hyperinflated. Lingular opacity on the prior
studies has resolved. There is likely a trace residual left pleural
effusion. No new airspace consolidation or pneumothorax is
identified. No acute osseous abnormality is seen.
IMPRESSION: Resolved lingular pneumonia with trace residual left pleural
effusion.

## 2019-01-08 ENCOUNTER — Other Ambulatory Visit: Payer: Self-pay

## 2019-01-08 DIAGNOSIS — R062 Wheezing: Secondary | ICD-10-CM

## 2019-01-08 DIAGNOSIS — J4520 Mild intermittent asthma, uncomplicated: Secondary | ICD-10-CM

## 2019-01-08 MED ORDER — FLUTICASONE PROPIONATE 50 MCG/ACT NA SUSP: NASAL | 1 refills | Status: DC

## 2019-01-14 ENCOUNTER — Telehealth (INDEPENDENT_AMBULATORY_CARE_PROVIDER_SITE_OTHER): Payer: BC Managed Care – PPO | Admitting: Family Medicine

## 2019-01-14 ENCOUNTER — Other Ambulatory Visit: Payer: Self-pay

## 2019-01-14 DIAGNOSIS — J4520 Mild intermittent asthma, uncomplicated: Secondary | ICD-10-CM | POA: Diagnosis not present

## 2019-01-14 DIAGNOSIS — Z8701 Personal history of pneumonia (recurrent): Secondary | ICD-10-CM

## 2019-01-14 DIAGNOSIS — J309 Allergic rhinitis, unspecified: Secondary | ICD-10-CM | POA: Diagnosis not present

## 2019-01-14 DIAGNOSIS — R062 Wheezing: Secondary | ICD-10-CM | POA: Diagnosis not present

## 2019-01-14 DIAGNOSIS — Z1211 Encounter for screening for malignant neoplasm of colon: Secondary | ICD-10-CM

## 2019-01-14 MED ORDER — FLUTICASONE PROPIONATE 50 MCG/ACT NA SUSP: NASAL | 11 refills | Status: DC

## 2019-01-14 MED ORDER — ALBUTEROL SULFATE HFA 108 (90 BASE) MCG/ACT IN AERS: 1.0000 | INHALATION_SPRAY | RESPIRATORY_TRACT | 0 refills | Status: DC | PRN

## 2019-01-14 NOTE — Patient Instructions (Signed)
Good talking to you today.  I refilled the flonase, and also albuterol if needed.  If you do require albuterol more than once or twice a week, please schedule appointment so we can discuss other treatments.  Pneumonia vaccine was ordered, should be able to be done at a procedure only visit, you should not need an appointment.  Please request flu shot at that time as well.  Ideally would like to see this given next week if that works for your schedule.  If any return of pneumonia, would recommend meeting with immunologist to determine if other testing needed.  I placed a referral to gastroenterology for colon cancer screening.  Please schedule a physical with me in the next 6 months so we can review other screening labs and tests.  Let me know if there are any questions during that time.

## 2019-01-14 NOTE — Progress Notes (Signed)
Virtual Visit via Video Note  I connected with Brian Morrison on 01/14/19 at 11:49 AM by a video enabled telemedicine application and verified that I am speaking with the correct person using two identifiers.   I discussed the limitations, risks, security and privacy concerns of performing an evaluation and management service by telephone and the availability of in person appointments. I also discussed with the patient that there may be a patient responsible charge related to this service. The patient expressed understanding and agreed to proceed, consent obtained  Chief complaint: Transition of care, med refill.  5 min chart review.    History of Present Illness: Brian Morrison is a 53 y.o. male  Last seen in October 2019 for follow-up of left lower lobe pneumonia with small left pleural effusion in August.  Had CT angios/chest August 30 with lingular pneumonia, associated parapneumonic effusion, reactive mediastinal lymph nodes.  X-ray on December 04, 2017, resolved lingular pneumonia with trace residual left pleural effusion. No residual cough, no fever, no breathing issues. No wt loss, hemoptysis/night sweats.  Reports multiple episodes pneumonia in past - approx 10. Has not met with allergist, no Pneumovax. Agrees to pneumovax. Would like to defer allergy eval at this time unless recurrent PNA.    Allergic rhinitis: Uses Flonase daily - 2spr/nostril QD. Working well.  No recent wheeze/asthma symptoms. Noticed after weight loss few years ago - asthma has imprved.   Health Maintenance: Colon: agrees to referral to GI.  Flu vaccine: will have done here with pneumovax.   Patient Active Problem List   Diagnosis Date Noted  . Asthma with exacerbation 07/18/2015  . Allergic rhinitis 07/18/2015  . Depression with anxiety 12/04/2011   Past Medical History:  Diagnosis Date  . Allergic rhinitis   . Anxiety   . Asthma   . Depression    No past surgical history on file. No Known  Allergies Prior to Admission medications   Medication Sig Start Date End Date Taking? Authorizing Provider  fluticasone (FLONASE) 50 MCG/ACT nasal spray Shake Liquid and use 2 sprays in each nostril everyday. 01/08/19  Yes Forrest Moron, MD   Social History   Socioeconomic History  . Marital status: Married    Spouse name: Not on file  . Number of children: 4  . Years of education: College  . Highest education level: Not on file  Occupational History  . Not on file  Social Needs  . Financial resource strain: Not on file  . Food insecurity    Worry: Not on file    Inability: Not on file  . Transportation needs    Medical: Not on file    Non-medical: Not on file  Tobacco Use  . Smoking status: Former Smoker    Start date: 12/03/1997  . Smokeless tobacco: Never Used  Substance and Sexual Activity  . Alcohol use: No    Alcohol/week: 0.0 standard drinks  . Drug use: No  . Sexual activity: Not on file  Lifestyle  . Physical activity    Days per week: Not on file    Minutes per session: Not on file  . Stress: Not on file  Relationships  . Social Herbalist on phone: Not on file    Gets together: Not on file    Attends religious service: Not on file    Active member of club or organization: Not on file    Attends meetings of clubs or organizations: Not on file  Relationship status: Not on file  . Intimate partner violence    Fear of current or ex partner: Not on file    Emotionally abused: Not on file    Physically abused: Not on file    Forced sexual activity: Not on file  Other Topics Concern  . Not on file  Social History Narrative   Drinks about 1 large cup of coffee a day     Observations/Objective: There were no vitals filed for this visit.  Speaking normally over video, no distress.  No cough.  Appropriate responses with all questions answered  Assessment and Plan: Allergic rhinitis, unspecified seasonality, unspecified trigger - Plan:  fluticasone (FLONASE) 50 MCG/ACT nasal spray  -Flonase refilled.    Reactive airway disease, mild intermittent, uncomplicated - Plan: albuterol (VENTOLIN HFA) 108 (90 Base) MCG/ACT inhaler, Pneumococcal vaccine Wheezing - Plan: albuterol (VENTOLIN HFA) 108 (90 Base) MCG/ACT inhaler  - no recent albuterol need, improved after weight loss. Refilled albuterol inhaler if needed.  RTC precautions.  History of bacterial pneumonia - Plan: Pneumococcal vaccine  -History of recurrent pneumonia.  Denies persistent symptoms from last year.  Improving x-ray last year.  Discussed meeting with immunologist given frequent pneumonias, deferred at this time.  Will provide Pneumovax at procedure only visit.  Plans on flu vaccine at that time as well.   Colon cancer screening, discussed Cologuard versus colonoscopy, chose to meet with gastroenterology for colonoscopy.  Plan  physical within the next 6 months to review other screening tests including lipids that were borderline elevated previously.  Follow Up Instructions:   Procedure only visit for Pneumovax and flu vaccine, 6 months for physical.  I discussed the assessment and treatment plan with the patient. The patient was provided an opportunity to ask questions and all were answered. The patient agreed with the plan and demonstrated an understanding of the instructions.   The patient was advised to call back or seek an in-person evaluation if the symptoms worsen or if the condition fails to improve as anticipated.  I provided 17 minutes of non-face-to-face time during this encounter.   Wendie Agreste, MD

## 2019-01-14 NOTE — Progress Notes (Signed)
CC- refill on nasal spray-Patient is doing well and do not have any other issues at this time. Just need a refill

## 2019-01-15 ENCOUNTER — Encounter: Payer: Self-pay | Admitting: Family Medicine

## 2019-01-15 ENCOUNTER — Ambulatory Visit (INDEPENDENT_AMBULATORY_CARE_PROVIDER_SITE_OTHER): Payer: BC Managed Care – PPO | Admitting: Family Medicine

## 2019-01-15 ENCOUNTER — Other Ambulatory Visit: Payer: Self-pay

## 2019-01-15 DIAGNOSIS — Z23 Encounter for immunization: Secondary | ICD-10-CM

## 2019-01-15 DIAGNOSIS — Z8701 Personal history of pneumonia (recurrent): Secondary | ICD-10-CM

## 2019-01-15 DIAGNOSIS — J4520 Mild intermittent asthma, uncomplicated: Secondary | ICD-10-CM

## 2019-01-15 NOTE — Progress Notes (Signed)
Nurse visit only for flu/pneumovax vaccines.

## 2019-01-26 ENCOUNTER — Ambulatory Visit (AMBULATORY_SURGERY_CENTER): Payer: BC Managed Care – PPO

## 2019-01-26 ENCOUNTER — Other Ambulatory Visit: Payer: Self-pay

## 2019-01-26 VITALS — Temp 97.1°F | Ht 73.7 in | Wt 192.0 lb

## 2019-01-26 DIAGNOSIS — Z1211 Encounter for screening for malignant neoplasm of colon: Secondary | ICD-10-CM

## 2019-01-26 DIAGNOSIS — Z1159 Encounter for screening for other viral diseases: Secondary | ICD-10-CM

## 2019-01-26 MED ORDER — NA SULFATE-K SULFATE-MG SULF 17.5-3.13-1.6 GM/177ML PO SOLN: 1.0000 | Freq: Once | ORAL | 0 refills | Status: AC

## 2019-01-26 NOTE — Progress Notes (Signed)
No egg or soy allergy known to patient  No issues with past sedation with any surgeries  or procedures, no intubation problems  No diet pills per patient No home 02 use per patient  No blood thinners per patient  Pt denies issues with constipation  No A fib or A flutter  EMMI video sent to pt's e mail   Due to the COVID-19 pandemic we are asking patients to follow these guidelines. Please only bring one care partner. Please be aware that your care partner may wait in the car in the parking lot or if they feel like they will be too hot to wait in the car, they may wait in the lobby on the 4th floor. All care partners are required to wear a mask the entire time (we do not have any that we can provide them), they need to practice social distancing, and we will do a Covid check for all patient's and care partners when you arrive. Also we will check their temperature and your temperature. If the care partner waits in their car they need to stay in the parking lot the entire time and we will call them on their cell phone when the patient is ready for discharge so they can bring the car to the front of the building. Also all patient's will need to wear a mask into building.  Coupon for suprep given.

## 2019-01-27 ENCOUNTER — Encounter: Payer: Self-pay | Admitting: Gastroenterology

## 2019-02-04 ENCOUNTER — Ambulatory Visit (INDEPENDENT_AMBULATORY_CARE_PROVIDER_SITE_OTHER): Payer: BC Managed Care – PPO

## 2019-02-04 ENCOUNTER — Other Ambulatory Visit: Payer: Self-pay | Admitting: Gastroenterology

## 2019-02-04 DIAGNOSIS — Z1159 Encounter for screening for other viral diseases: Secondary | ICD-10-CM

## 2019-02-05 LAB — SARS CORONAVIRUS 2 (TAT 6-24 HRS): SARS Coronavirus 2: NEGATIVE

## 2019-02-08 ENCOUNTER — Encounter: Payer: BLUE CROSS/BLUE SHIELD | Admitting: Gastroenterology

## 2019-02-09 ENCOUNTER — Encounter: Payer: Self-pay | Admitting: Gastroenterology

## 2019-02-09 ENCOUNTER — Other Ambulatory Visit: Payer: Self-pay

## 2019-02-09 ENCOUNTER — Ambulatory Visit (AMBULATORY_SURGERY_CENTER): Payer: BC Managed Care – PPO | Admitting: Gastroenterology

## 2019-02-09 VITALS — BP 132/81 | HR 53 | Temp 97.8°F | Resp 10 | Ht 73.0 in | Wt 192.0 lb

## 2019-02-09 DIAGNOSIS — D122 Benign neoplasm of ascending colon: Secondary | ICD-10-CM | POA: Diagnosis not present

## 2019-02-09 DIAGNOSIS — K635 Polyp of colon: Secondary | ICD-10-CM | POA: Diagnosis not present

## 2019-02-09 DIAGNOSIS — Z1211 Encounter for screening for malignant neoplasm of colon: Secondary | ICD-10-CM

## 2019-02-09 MED ORDER — SODIUM CHLORIDE 0.9 % IV SOLN: 500.0000 mL | Freq: Once | INTRAVENOUS | Status: DC

## 2019-02-09 NOTE — Progress Notes (Signed)
[name] [name]  Pt's states no medical or surgical changes since previsit or office visit.

## 2019-02-09 NOTE — Patient Instructions (Signed)
Handouts given:  Hemorrhoids, Diverticulosis and polyps  Await pathology results  continue previous diet Resume current medications    YOU HAD AN ENDOSCOPIC PROCEDURE TODAY AT [facility]:   Refer to the procedure report that was given to you for any specific questions about what was found during the examination.  If the procedure report does not answer your questions, please call your gastroenterologist to clarify.  If you requested that your care partner not be given the details of your procedure findings, then the procedure report has been included in a sealed envelope for you to review at your convenience later.  YOU SHOULD EXPECT: Some feelings of bloating in the abdomen. Passage of more gas than usual.  Walking can help get rid of the air that was put into your GI tract during the procedure and reduce the bloating. If you had a lower endoscopy (such as a colonoscopy or flexible sigmoidoscopy) you may notice spotting of blood in your stool or on the toilet paper. If you underwent a bowel prep for your procedure, you may not have a normal bowel movement for a few days.  Please Note:  You might notice some irritation and congestion in your nose or some drainage.  This is from the oxygen used during your procedure.  There is no need for concern and it should clear up in a day or so.  SYMPTOMS TO REPORT IMMEDIATELY:   Following lower endoscopy (colonoscopy or flexible sigmoidoscopy):  Excessive amounts of blood in the stool  Significant tenderness or worsening of abdominal pains  Swelling of the abdomen that is new, acute  Fever of 100F or higher   For urgent or emergent issues, a gastroenterologist can be reached at any hour by calling [phone].   DIET:  We do recommend a small meal at first, but then you may proceed to your regular diet.  Drink plenty of fluids but you should avoid alcoholic beverages for 24 hours.  ACTIVITY:  You should plan to take it easy for  the rest of today and you should NOT DRIVE or use heavy machinery until tomorrow (because of the sedation medicines used during the test).    FOLLOW UP: Our staff will call the number listed on your records 48-72 hours following your procedure to check on you and address any questions or concerns that you may have regarding the information given to you following your procedure. If we do not reach you, we will leave a message.  We will attempt to reach you two times.  During this call, we will ask if you have developed any symptoms of COVID 19. If you develop any symptoms (ie: fever, flu-like symptoms, shortness of breath, cough etc.) before then, please call [phone].  If you test positive for Covid 19 in the 2 weeks post procedure, please call and report this information to [place].    If any biopsies were taken you will be contacted by phone or by letter within the next 1-3 weeks.  Please call us at [phone] if you have not heard about the biopsies in 3 weeks.    SIGNATURES/CONFIDENTIALITY: You and/or your care partner have signed paperwork which will be entered into your electronic medical record.  These signatures attest to the fact that that the information above on your After Visit Summary has been reviewed and is understood.  Full responsibility of the confidentiality of this discharge information lies with you and/or your care-partner.

## 2019-02-09 NOTE — Progress Notes (Signed)
Report given to PACU, vss 

## 2019-02-09 NOTE — Progress Notes (Signed)
Called to room to assist during endoscopic procedure.  Patient ID and intended procedure confirmed with present staff. Received instructions for my participation in the procedure from the performing physician.  

## 2019-02-09 NOTE — Op Note (Signed)
[facility] Patient Name: Brian Morrison Procedure Date: 02/09/2019 9:43 AM MRN: [id number] Endoscopist: Remo Lipps P. Havery Moros , MD Age: 53 Referring MD:  Date of Birth: December 24, 1965 Gender: Male Account #: [id number] Procedure:                Colonoscopy Indications:              Screening for colorectal malignant neoplasm, This                            is the patient's first colonoscopy Medicines:                Monitored Anesthesia Care Procedure:                Pre-Anesthesia Assessment:                           - Prior to the procedure, a History and Physical                            was performed, and patient medications and                            allergies were reviewed. The patient's tolerance of                            previous anesthesia was also reviewed. The risks                            and benefits of the procedure and the sedation                            options and risks were discussed with the patient.                            All questions were answered, and informed consent                            was obtained. Prior Anticoagulants: The patient has                            taken no previous anticoagulant or antiplatelet                            agents. ASA Grade Assessment: II - A patient with                            mild systemic disease. After reviewing the risks                            and benefits, the patient was deemed in                            satisfactory condition to undergo the procedure.  After obtaining informed consent, the colonoscope                            was passed under direct vision. Throughout the                            procedure, the patient's blood pressure, pulse, and                            oxygen saturations were monitored continuously. The                            Colonoscope was introduced through the anus and                            advanced to the the  cecum, identified by                            appendiceal orifice and ileocecal valve. The                            colonoscopy was performed without difficulty. The                            patient tolerated the procedure well. The quality                            of the bowel preparation was adequate. The                            ileocecal valve, appendiceal orifice, and rectum                            were photographed. Scope In: 9:45:20 AM Scope Out: 10:02:50 AM Scope Withdrawal Time: 0 hours 15 minutes 51 seconds  Total Procedure Duration: 0 hours 17 minutes 30 seconds  Findings:                 The perianal and digital rectal examinations were                            normal.                           A 5 mm polyp was found in the ascending colon. The                            polyp was sessile. The polyp was removed with a                            cold snare. Resection and retrieval were complete.                           A few small-mouthed diverticula were found in the  sigmoid colon.                           Internal hemorrhoids were found during                            retroflexion. The hemorrhoids were small.                           The exam was otherwise without abnormality. Complications:            No immediate complications. Estimated blood loss:                            Minimal. Estimated Blood Loss:     Estimated blood loss was minimal. Impression:               - One 5 mm polyp in the ascending colon, removed                            with a cold snare. Resected and retrieved.                           - Diverticulosis in the sigmoid colon.                           - Internal hemorrhoids.                           - The examination was otherwise normal. Recommendation:           - Patient has a contact number available for                            emergencies. The signs and symptoms of potential                             delayed complications were discussed with the                            patient. Return to normal activities tomorrow.                            Written discharge instructions were provided to the                            patient.                           - Resume previous diet.                           - Continue present medications.                           - Await pathology results. Remo Lipps P. Armbruster, MD 02/09/2019 10:05:34 AM This report has been signed electronically.

## 2019-02-11 ENCOUNTER — Telehealth: Payer: Self-pay

## 2019-02-11 NOTE — Telephone Encounter (Signed)
  Follow up Call-  Call back number 02/09/2019  Post procedure Call Back phone  # [phone]  Permission to leave phone message Yes  Some recent data might be hidden     Patient questions:  Do you have a fever, pain , or abdominal swelling? No. Pain Score  0 *  Have you tolerated food without any problems? Yes.    Have you been able to return to your normal activities? Yes.    Do you have any questions about your discharge instructions: Diet   No. Medications  No. Follow up visit  No.  Do you have questions or concerns about your Care? No.  Actions: * If pain score is 4 or above: No action needed, pain <4.  1. Have you developed a fever since your procedure? no  2.   Have you had an respiratory symptoms (SOB or cough) since your procedure? no  3.   Have you tested positive for COVID 19 since your procedure no  4.   Have you had any family members/close contacts diagnosed with the COVID 19 since your procedure?  no   If yes to any of these questions please route to Joylene John, RN and Alphonsa Gin, Therapist, sports.

## 2019-03-29 ENCOUNTER — Other Ambulatory Visit: Payer: BC Managed Care – PPO

## 2019-09-17 ENCOUNTER — Encounter: Payer: Self-pay | Admitting: Registered Nurse

## 2019-09-17 ENCOUNTER — Other Ambulatory Visit: Payer: Self-pay

## 2019-09-17 ENCOUNTER — Ambulatory Visit: Payer: BC Managed Care – PPO | Admitting: Registered Nurse

## 2019-09-17 VITALS — BP 122/76 | HR 62 | Temp 98.1°F | Resp 18 | Ht 73.0 in | Wt 189.4 lb

## 2019-09-17 DIAGNOSIS — L600 Ingrowing nail: Secondary | ICD-10-CM | POA: Diagnosis not present

## 2019-09-17 DIAGNOSIS — G5761 Lesion of plantar nerve, right lower limb: Secondary | ICD-10-CM

## 2019-09-17 DIAGNOSIS — Z23 Encounter for immunization: Secondary | ICD-10-CM

## 2019-09-17 NOTE — Progress Notes (Signed)
Acute Office Visit  Subjective:    Patient ID: Brian Morrison, male    DOB: 01-05-1966, 54 y.o.   MRN: [id number]  Chief Complaint  Patient presents with   Ingrown Toenail    Patient states he had an ingrown toenail on the right foot for weeks. Per patient he has been trying to get it out with nail clippers but has been unsuccessful,    HPI Patient is in today for ingrown toenail and foot pain  Ongoing ingrown nail for some time - has tried to trim it himself without success. Painful, tender. Had mild pus that has since resolved. No redness, streaking, warmth, or signs of infection. No clear cyst or drainable abscess. Has happened in the past, had this removed in our office.  Also notes pain in foot near joints of second and third digits. Dorsal aspect of foot. Some firm swelling, nonmobile. Occasionally tender. Worse with heavy activity - he runs every other day. Has been ongoing for some time, he is hesitant to address this as he doesn't want to take a break from activity.  Otherwise no complaints.   Past Medical History:  Diagnosis Date   Allergic rhinitis    Allergy    Anxiety    Asthma    Depression     Past Surgical History:  Procedure Laterality Date   NO PAST SURGERIES      Family History  Problem Relation Age of Onset   COPD Mother    Colon polyps Father    Lung cancer Maternal Grandmother    Alzheimer's disease Maternal Grandfather    Heart disease Paternal Grandfather    Colon cancer Neg Hx    Esophageal cancer Neg Hx    Rectal cancer Neg Hx    Stomach cancer Neg Hx     Social History   Socioeconomic History   Marital status: Married    Spouse name: Not on file   Number of children: 4   Years of education: College   Highest education level: Not on file  Occupational History   Not on file  Tobacco Use   Smoking status: Former Smoker    Years: 10.00    Start date: 12/03/1997    Quit date: 1999    Years since quitting:  22.5   Smokeless tobacco: Never Used  Scientific laboratory technician Use: Never used  Substance and Sexual Activity   Alcohol use: No    Alcohol/week: 0.0 standard drinks   Drug use: No   Sexual activity: Not on file  Other Topics Concern   Not on file  Social History Narrative   Drinks about 1 large cup of coffee a day    Social Determinants of Radio broadcast assistant Strain:    Difficulty of Paying Living Expenses:   Food Insecurity:    Worried About Charity fundraiser in the Last Year:    Arboriculturist in the Last Year:   Transportation Needs:    Film/video editor (Medical):    Lack of Transportation (Non-Medical):   Physical Activity:    Days of Exercise per Week:    Minutes of Exercise per Session:   Stress:    Feeling of Stress :   Social Connections:    Frequency of Communication with Friends and Family:    Frequency of Social Gatherings with Friends and Family:    Attends Religious Services:    Active Member of Clubs or Organizations:  Attends Archivist Meetings:    Marital Status:   Intimate Partner Violence:    Fear of Current or Ex-Partner:    Emotionally Abused:    Physically Abused:    Sexually Abused:     Outpatient Medications Prior to Visit  Medication Sig Dispense Refill   albuterol (VENTOLIN HFA) 108 (90 Base) MCG/ACT inhaler Inhale 1-2 puffs into the lungs every 4 (four) hours as needed for wheezing or shortness of breath. 18 g 0   fluticasone (FLONASE) 50 MCG/ACT nasal spray Shake Liquid and use 2 sprays in each nostril everyday. 16 g 11   No facility-administered medications prior to visit.    No Known Allergies  Review of Systems  Constitutional: Negative.   HENT: Negative.   Eyes: Negative.   Respiratory: Negative.   Cardiovascular: Negative.   Gastrointestinal: Negative.   Endocrine: Negative.   Genitourinary: Negative.   Musculoskeletal: Negative.   Skin: Negative.   Allergic/Immunologic:  Negative.   Neurological: Negative.   Hematological: Negative.   Psychiatric/Behavioral: Negative.   All other systems reviewed and are negative.      Objective:    Physical Exam Vitals and nursing note reviewed.  Constitutional:      General: He is not in acute distress.    Appearance: Normal appearance. He is normal weight. He is not ill-appearing, toxic-appearing or diaphoretic.  Cardiovascular:     Rate and Rhythm: Normal rate and regular rhythm.  Musculoskeletal:        General: Swelling (near second and third digit of R foot. ), tenderness and signs of injury present.  Skin:    General: Skin is warm and dry.     Capillary Refill: Capillary refill takes less than 2 seconds.     Coloration: Skin is not jaundiced or pale.     Findings: Erythema (mild surrounding medial edge of nail of great toe on right foot) present. No bruising, lesion or rash.  Neurological:     General: No focal deficit present.     Mental Status: He is alert and oriented to person, place, and time. Mental status is at baseline.  Psychiatric:        Mood and Affect: Mood normal.        Behavior: Behavior normal.        Thought Content: Thought content normal.        Judgment: Judgment normal.     BP 122/76    Pulse 62    Temp 98.1 F (36.7 C) (Temporal)    Resp 18    Ht 6\' 1"  (1.854 m)    Wt 189 lb 6.4 oz (85.9 kg)    SpO2 100%    BMI 24.99 kg/m  Wt Readings from Last 3 Encounters:  09/17/19 189 lb 6.4 oz (85.9 kg)  02/09/19 192 lb (87.1 kg)  01/26/19 192 lb (87.1 kg)    Health Maintenance Due  Topic Date Due   Hepatitis C Screening  Never done   HIV Screening  Never done   TETANUS/TDAP  Never done    There are no preventive care reminders to display for this patient.   Lab Results  Component Value Date   TSH 1.63 07/18/2015   Lab Results  Component Value Date   WBC 8.4 10/29/2017   HGB 14.4 10/29/2017   HCT 41.8 10/29/2017   MCV 89.5 10/29/2017   PLT 229 10/29/2017   Lab  Results  Component Value Date   NA 139 10/29/2017   K  4.0 10/29/2017   CO2 25 10/29/2017   GLUCOSE 86 10/29/2017   BUN 13 10/29/2017   CREATININE 0.91 10/29/2017   BILITOT 0.9 07/18/2015   ALKPHOS 54 07/18/2015   AST 17 07/18/2015   ALT 16 07/18/2015   PROT 7.1 07/18/2015   ALBUMIN 4.7 07/18/2015   CALCIUM 8.7 (L) 10/29/2017   ANIONGAP 10 10/29/2017   Lab Results  Component Value Date   CHOL 175 01/13/2017   Lab Results  Component Value Date   HDL 40 01/13/2017   Lab Results  Component Value Date   LDLCALC 112 (H) 01/13/2017   Lab Results  Component Value Date   TRIG 117 01/13/2017   Lab Results  Component Value Date   CHOLHDL 4.4 01/13/2017   No results found for: HGBA1C     Assessment & Plan:   Problem List Items Addressed This Visit      Nervous and Auditory   Morton's neuroma of right foot     Musculoskeletal and Integument   Ingrown toenail of right foot - Primary    Other Visit Diagnoses    Need for prophylactic vaccination with combined diphtheria-tetanus-pertussis (DTP) vaccine       Relevant Orders   Tdap vaccine greater than or equal to 7yo IM (Completed)       No orders of the defined types were placed in this encounter.  PLAN  Pt has attempted to address ingrown nail at home - unfortunately this has made it unclear where nail edge is, and if there is any remaining impacted portion  Suspect morton's neuroma of R foot. Will refer to podiatry for further work up  Patient encouraged to call clinic with any questions, comments, or concerns.   Maximiano Coss, NP

## 2019-09-17 NOTE — Patient Instructions (Signed)
° ° ° °  If you have lab work done today you will be contacted with your lab results within the next 2 weeks.  If you have not heard from us then please contact us. The fastest way to get your results is to register for My Chart. ° ° °IF you received an x-ray today, you will receive an invoice from [facility]. Please contact [facility] at [phone] with questions or concerns regarding your invoice.  ° °IF you received labwork today, you will receive an invoice from [facility]. Please contact [facility] at [phone] with questions or concerns regarding your invoice.  ° °Our billing staff will not be able to assist you with questions regarding bills from these companies. ° °You will be contacted with the lab results as soon as they are available. The fastest way to get your results is to activate your My Chart account. Instructions are located on the last page of this paperwork. If you have not heard from us regarding the results in 2 weeks, please contact this office. °  ° ° ° °

## 2019-09-21 ENCOUNTER — Ambulatory Visit (INDEPENDENT_AMBULATORY_CARE_PROVIDER_SITE_OTHER): Payer: BC Managed Care – PPO

## 2019-09-21 ENCOUNTER — Other Ambulatory Visit: Payer: Self-pay

## 2019-09-21 ENCOUNTER — Other Ambulatory Visit: Payer: Self-pay | Admitting: Podiatry

## 2019-09-21 ENCOUNTER — Encounter: Payer: Self-pay | Admitting: Podiatry

## 2019-09-21 ENCOUNTER — Ambulatory Visit: Payer: BC Managed Care – PPO | Admitting: Podiatry

## 2019-09-21 DIAGNOSIS — M9271 Juvenile osteochondrosis of metatarsus, right foot: Secondary | ICD-10-CM

## 2019-09-21 DIAGNOSIS — G5761 Lesion of plantar nerve, right lower limb: Secondary | ICD-10-CM

## 2019-09-21 DIAGNOSIS — L600 Ingrowing nail: Secondary | ICD-10-CM | POA: Diagnosis not present

## 2019-09-21 DIAGNOSIS — M19079 Primary osteoarthritis, unspecified ankle and foot: Secondary | ICD-10-CM | POA: Diagnosis not present

## 2019-09-21 MED ORDER — NEOMYCIN-POLYMYXIN-HC 3.5-10000-1 OT SOLN
OTIC | 0 refills | Status: DC
Start: 2019-09-21 — End: 2020-03-08

## 2019-09-21 NOTE — Patient Instructions (Signed)

## 2019-09-21 NOTE — Progress Notes (Signed)
Subjective:  Patient ID: Brian Morrison, male    DOB: 1965/06/14,  MRN: [id number]  Chief Complaint  Patient presents with   Ingrown Toenail    R hallux, medial border. x2 wks. Tenderness. Pt thinks that he saw a minimal amount of pus x1 day. No other drainage or bleeding.   Neuroma    R dorsal forefoot (below 3rd and 4th toes) - diagnosed 2+ yrs ago. It is not usually painful. Pt is interested in discussing surgery.    54 y.o. male presents with the above complaint. History confirmed with patient.  Dental has been getting worse, he thinks he has had a little bit of drainage but has not been significantly infected.  He was also diagnosed with a neuroma in the right foot which bothers him above the third and fourth toes.  No treatment for this thus far.  He was interested in neuroma surgery  Objective:  Physical Exam: warm, good capillary refill, no trophic changes or ulcerative lesions, normal DP and PT pulses and normal sensory exam. Left Foot: normal exam, no swelling, tenderness, instability; ligaments intact, full range of motion of all ankle/foot joints  Right Foot: Ingrown toenail of hallux medial border, no paronychia noted, cavus foot type, painful and limited range of motion of the second and third metatarsophalangeal joints (the third is the most severe) with palpable dorsal spurs present overlying the third metatarsal phalangeal joint.  Mild pain on the plantar sulcus.  No pain with lateral compression.  Negative Mulder sign.  Negative Sullivan sign.  No paresthesias in digits  .  Radiographs: X-ray of the right foot: Flattening of the metatarsal head of the third metatarsal, and second metatarsal which is less severe, with sclerosis and dorsal osteophyte formation of the base of the proximal phalanx and head of the metatarsal of the third MTPJ Assessment:   1. Freiberg's disease, right   2. Arthritis of metatarsophalangeal joint   3. Ingrown right greater toenail       Plan:  Patient was evaluated and treated and all questions answered.   -Patient elects to proceed with ingrown toenail removal today -Ingrown nail excised. See procedure note. -Educated on post-procedure care including soaking. Written instructions provided. -Rx for Cortisporin drops -Patient to follow up in 2 weeks for nail check.  Procedure: Excision of Ingrown Toenail Location: Right 1st toe medial nail borders. Anesthesia: Lidocaine 2% plain; 1.52mL and Marcaine 0.5% plain; 1.82mL, digital block. Skin Prep: Betadine. Dressing: Silvadene; telfa; dry, sterile, compression dressing. Technique: Following skin prep, the toe was exsanguinated and a tourniquet was secured at the base of the toe. The affected nail border was freed, split with a nail splitter, and excised. Chemical matrixectomy was then performed with phenol and irrigated out with alcohol. The tourniquet was then removed and sterile dressing applied. Disposition: Patient tolerated procedure well. Patient to return in 2 weeks for follow-up.   -I discussed in detail with him that his symptoms are not related to a neuroma but likely Freiberg's infarct of the third metatarsal phalangeal joint, less likely but still possible based on x-rays and clinical exam the second as well.  We discussed surgical treatment for this including joint resection arthroplasty with implant, interpositional arthroplasty, and osteotomy to preserve the joint.  I would like to order an MRI to evaluate the quality of the joint surface to see if the joint is salvageable if he should have a complete joint replacement.  He is going away on vacation and 2-1/2 weeks, and we  will reevaluate his ingrown toenail prior to this and then discussed surgery and plan after MRI in 1 month.  Return in about 2 weeks (around 10/05/2019) for nail re-check.   Lanae Crumbly, DPM 09/21/2019

## 2019-09-23 ENCOUNTER — Telehealth: Payer: Self-pay | Admitting: *Deleted

## 2019-09-23 DIAGNOSIS — M19079 Primary osteoarthritis, unspecified ankle and foot: Secondary | ICD-10-CM

## 2019-09-23 DIAGNOSIS — M9271 Juvenile osteochondrosis of metatarsus, right foot: Secondary | ICD-10-CM

## 2019-09-23 NOTE — Telephone Encounter (Signed)
-----   Message from Criselda Peaches, DPM sent at 09/21/2019  1:08 PM EDT ----- Yvonne Kendall, would you be able to order a right forefoot MRI, non contrast? Diagnosis is arthritis of metatarsophalangeal joint and Freiberg's disease of right foot, attention to 3rd and 2nd MTPJs.   Thanks! Quita Skye

## 2019-09-23 NOTE — Telephone Encounter (Signed)
Orders to Dr. Maxie Barb assistant for pre-cert and faxed to [facility].

## 2019-10-07 ENCOUNTER — Other Ambulatory Visit: Payer: Self-pay

## 2019-10-07 ENCOUNTER — Encounter: Payer: Self-pay | Admitting: Podiatry

## 2019-10-07 ENCOUNTER — Ambulatory Visit: Payer: BC Managed Care – PPO | Admitting: Podiatry

## 2019-10-07 DIAGNOSIS — M9271 Juvenile osteochondrosis of metatarsus, right foot: Secondary | ICD-10-CM | POA: Diagnosis not present

## 2019-10-07 DIAGNOSIS — M19079 Primary osteoarthritis, unspecified ankle and foot: Secondary | ICD-10-CM

## 2019-10-07 DIAGNOSIS — L600 Ingrowing nail: Secondary | ICD-10-CM

## 2019-10-07 NOTE — Telephone Encounter (Signed)
[employer] Comment AUTHORIZATION:  [id number] FOR MRI RIGHT FOOT [phone]. Faxed orders with copy of authorization to fax [facility].

## 2019-10-07 NOTE — Progress Notes (Signed)
  Subjective:  Patient ID: Brian Morrison, male    DOB: [birth year],  MRN: [id number]  Chief Complaint  Patient presents with  . Neuroma    2wk f/u for Mortons Neuroma RT Hallux    54 y.o. male presents with the above complaint. History confirmed with patient. He has his MRI scheduled for next Thursday for the arthritis in the 3rd toe joint  Objective:  Physical Exam: warm, good capillary refill, normal DP and PT pulses and normal sensory exam.   Right Foot: nail avulsion site healing well. Limited ROM and pain of the 3rd MTPJ   Assessment:   1. Ingrown right greater toenail   2. Freiberg's disease, right   3. Arthritis of metatarsophalangeal joint      Plan:  Patient was evaluated and treated and all questions answered.   - nail avulsion is healing well. Return to full activity. Can d/c soaks and leave OTA - return after MRI to discuss results, plan treatment options  Return in about 2 weeks (around 10/21/2019) for to review MRI .

## 2019-10-14 ENCOUNTER — Other Ambulatory Visit: Payer: Self-pay

## 2019-10-14 ENCOUNTER — Ambulatory Visit
Admission: RE | Admit: 2019-10-14 | Discharge: 2019-10-14 | Disposition: A | Discharge status: Home / Self Care | Payer: BC Managed Care – PPO | Source: Ambulatory Visit | Attending: Podiatry | Admitting: Podiatry

## 2019-10-14 DIAGNOSIS — M19071 Primary osteoarthritis, right ankle and foot: Secondary | ICD-10-CM | POA: Diagnosis not present

## 2019-10-14 DIAGNOSIS — R600 Localized edema: Secondary | ICD-10-CM | POA: Diagnosis not present

## 2019-10-14 DIAGNOSIS — G8929 Other chronic pain: Secondary | ICD-10-CM | POA: Diagnosis not present

## 2019-10-14 DIAGNOSIS — M87874 Other osteonecrosis, right foot: Secondary | ICD-10-CM | POA: Diagnosis not present

## 2019-10-16 DIAGNOSIS — S51812A Laceration without foreign body of left forearm, initial encounter: Secondary | ICD-10-CM | POA: Diagnosis not present

## 2019-10-21 ENCOUNTER — Ambulatory Visit: Payer: BC Managed Care – PPO | Admitting: Podiatry

## 2019-10-21 ENCOUNTER — Other Ambulatory Visit: Payer: Self-pay

## 2019-10-21 DIAGNOSIS — M9271 Juvenile osteochondrosis of metatarsus, right foot: Secondary | ICD-10-CM

## 2019-10-21 DIAGNOSIS — M19079 Primary osteoarthritis, unspecified ankle and foot: Secondary | ICD-10-CM

## 2019-10-21 NOTE — Patient Instructions (Signed)
Pre-Operative Instructions  Congratulations, you have decided to take an important step towards improving your quality of life.  You can be assured that the doctors and staff at [facility] will be with you every step of the way.  Here are some important things you should know:  1. Plan to be at the surgery center/hospital at least 1 (one) hour prior to your scheduled time, unless otherwise directed by the surgical center/hospital staff.  You must have a responsible adult accompany you, remain during the surgery and drive you home.  Make sure you have directions to the surgical center/hospital to ensure you arrive on time. 2. If you are having surgery at [facility] or [facility] hospitals, you will need a copy of your medical history and physical form from your family physician within one month prior to the date of surgery. We will give you a form for your primary physician to complete.  3. We make every effort to accommodate the date you request for surgery.  However, there are times where surgery dates or times have to be moved.  We will contact you as soon as possible if a change in schedule is required.   4. No aspirin/ibuprofen for one week before surgery.  If you are on aspirin, any non-steroidal anti-inflammatory medications (Mobic, Aleve, Ibuprofen) should not be taken seven (7) days prior to your surgery.  You make take Tylenol for pain prior to surgery.  5. Medications - If you are taking daily heart and blood pressure medications, seizure, reflux, allergy, asthma, anxiety, pain or diabetes medications, make sure you notify the surgery center/hospital before the day of surgery so they can tell you which medications you should take or avoid the day of surgery. 6. No food or drink after midnight the night before surgery unless directed otherwise by surgical center/hospital staff. 7. No alcoholic beverages 24-hours prior to surgery.  No smoking 24-hours prior or 24-hours after  surgery. 8. Wear loose pants or shorts. They should be loose enough to fit over bandages, boots, and casts. 9. Don't wear slip-on shoes. Sneakers are preferred. 10. Bring your boot with you to the surgery center/hospital.  Also bring crutches or a walker if your physician has prescribed it for you.  If you do not have this equipment, it will be provided for you after surgery. 11. If you have not been contacted by the surgery center/hospital by the day before your surgery, call to confirm the date and time of your surgery. 12. Leave-time from work may vary depending on the type of surgery you have.  Appropriate arrangements should be made prior to surgery with your employer. 13. Prescriptions will be provided immediately following surgery by your doctor.  Fill these as soon as possible after surgery and take the medication as directed. Pain medications will not be refilled on weekends and must be approved by the doctor. 14. Remove nail polish on the operative foot and avoid getting pedicures prior to surgery. 15. Wash the night before surgery.  The night before surgery wash the foot and leg well with water and the antibacterial soap provided. Be sure to pay special attention to beneath the toenails and in between the toes.  Wash for at least three (3) minutes. Rinse thoroughly with water and dry well with a towel.  Perform this wash unless told not to do so by your physician.  Enclosed: 1 Ice pack (please put in freezer the night before surgery)   1 Hibiclens skin cleaner     Pre-op instructions  If you have any questions regarding the instructions, please do not hesitate to call our office.  [place]: [address], [place], [place] [address] -- [phone]  [place]: [address] [place], [place] [address] -- [phone]  [place]: [address], [place], [place] [address] -- [phone]   Website: [email or web address] 

## 2019-10-21 NOTE — Progress Notes (Signed)
Subjective:  Patient ID: Brian Morrison, male    DOB: 16-Dec-1965,  MRN: [id number]  Chief Complaint  Patient presents with  . Foot Pain      2wk f/u to review MRI     54 y.o. male presents with the above complaint. History confirmed with patient.    Objective:  Physical Exam: warm, good capillary refill, no trophic changes or ulcerative lesions, normal DP and PT pulses and normal sensory exam. Left Foot: normal exam, no swelling, tenderness, instability; ligaments intact, full range of motion of all ankle/foot joints  Right Foot: painful and limited range of motion of the third metatarsophalangeal joints (the third is the most severe) with palpable dorsal spurs present overlying the third metatarsal phalangeal joint.  Mild pain on the plantar sulcus.  No pain with lateral compression.  Negative Mulder sign.  Negative Sullivan sign.  No paresthesias in digits  .  Radiographs: X-ray of the right foot: Flattening of the metatarsal head of the third metatarsal, and second metatarsal which is less severe, with sclerosis and dorsal osteophyte formation of the base of the proximal phalanx and head of the metatarsal of the third MTPJ  Study Result  Narrative & Impression  CLINICAL DATA:  Chronic pain and burning sensation at the base of the second and third toes. No injury or prior surgery.  EXAM: MRI OF THE RIGHT FOREFOOT WITHOUT CONTRAST  TECHNIQUE: Multiplanar, multisequence MR imaging of the right forefoot was performed. No intravenous contrast was administered.  COMPARISON:  Right foot x-rays dated September 21, 2019.  FINDINGS: Bones/Joint/Cartilage  Irregularity, flattening, and subchondral signal abnormality in the third metatarsal head consistent with avascular necrosis. Reactive marrow edema extends into the shaft of the third metatarsal and also involves the majority of the third proximal phalanx. Secondary moderate to severe osteoarthritis of the third MTP  joint.  No fracture or dislocation. Mild degenerative subchondral marrow edema in the first metatarsal head. No joint effusion.  Ligaments  Collateral ligaments are intact.  Lisfranc ligament is intact.  Muscles and Tendons Flexor and extensor compartment tendons are intact. No muscle edema or atrophy.  Soft tissue No fluid collection or hematoma.  No soft tissue mass.  IMPRESSION: 1. 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 2. 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   Electronically Signed   By: Titus Dubin M.D.   On: 10/15/2019 07:52     Assessment:   1. Freiberg's disease, right   2. Arthritis of metatarsophalangeal joint      Plan:  Patient was evaluated and treated and all questions answered.    -Today we discussed the results of the MRI and that he does indeed have Freiberg's and this has has led to end-stage arthrosis of the third MTPJ on the right foot.  We discussed treatment options including nonsurgical treatment with a rigid shoe or carbon fiber insert to offload the joint as well as surgical options.  I discussed with him that his MRI does appear to be consistent with end-stage arthritis and the likelihood of joint salvage being successful at this point is likely very low.  He is an active person and likes to run, and motion is important to him.  I recommended that our surgical plan should aim to preserve this, and I recommended joint arthroplasty with implant.  We discussed the advantages and disadvantages of a metallic hemiimplant versus a Silastic total joint implant, and that this  decision likely would be made intraoperatively based on the amount of motion that I can gain in the quality of the articular surface of the proximal phalanx.  We discussed all the risk, benefits, and potential complications as well as the expected postoperative course.  I also discussed  the above with his wife who is available via phone today during the visit.  We discussed that implant arthroplasty may not last forever, and he potentially could have the need for revision surgery in the future given his young age if the implant fails.  We also discussed the rare complication of dendritic synovitis from Silastic implants. -Informed consent reviewing the above was signed in the office today.  He would likely plan for surgery in the next few weeks, and he will discuss this further with his family and we will schedule surgery at a mutually agreeable date.   Surgical plan:  Procedure: -Arthroplasty of third right MTPJ with implant  Location: -[facility]  Anesthesia plan: -IV sedation with local anesthesia  Postoperative pain plan: - Tylenol 1000 mg every 6 hours, ibuprofen 600 mg every 6 hours, gabapentin 300 mg every 8 hours x5 days, oxycodone 5 mg 1-2 tabs every 6 hours only as needed  DVT prophylaxis: -ASA 81 mg twice daily  WB Restrictions / DME needs: -CAM boot, short, this was dispensed today in preparation for surgery   Return for post op.   Lanae Crumbly, DPM 10/21/2019

## 2019-10-22 ENCOUNTER — Telehealth: Payer: Self-pay | Admitting: Podiatry

## 2019-10-22 NOTE — Telephone Encounter (Signed)
Called pt back to get him scheduled for sx. Asked if he still wanted to do sx on Friday, 9/03. Pt requested that date for sx and asked if his sx would be in the morning. I told him it should be in the morning to mid morning but I could not give him a time. Told him someone from [facility] would call a day or two prior to let him know of his sx time and what time to arrive for pre-op. Told pt to go ahead and register online via [employer] for the sx center and they would call him with any questions they had. Told pt to call us in the meantime with any questions and/or concerns he may have.

## 2019-10-22 NOTE — Telephone Encounter (Signed)
I'm calling to schedule sx with Dr. Sherryle Lis. Look forward to hearing back from you. Thanks. Bye.

## 2019-10-27 ENCOUNTER — Telehealth: Payer: Self-pay | Admitting: Podiatry

## 2019-10-27 NOTE — Telephone Encounter (Signed)
No he will be WB as tolerated in the CAM boot I gave him. I spoke with Ander Purpura from [facility] and have the implants ordered. Thanks!

## 2019-10-27 NOTE — Telephone Encounter (Signed)
I was looking in the sx folder and saw the card for the link of items to possibly purchase and I saw that one is a knee scooter. Nothing was mentioned to me, but is that something Dr. Sherryle Lis recommends I have to use after sx or will I be able to walk?

## 2019-10-27 NOTE — Telephone Encounter (Signed)
Called pt to let him know that per Dr. Sherryle Lis, he does not need the knee scooter. Stated he would be WB in the [employer]. Told pt to call us with any other questions and/or concerns prior to surgery.

## 2019-11-03 ENCOUNTER — Telehealth: Payer: Self-pay

## 2019-11-03 NOTE — Telephone Encounter (Signed)
DOS 11/05/2019  ARTHROPLASTY 3RD MTPJ W/IMPLANT RT - 01222  [employer] EFFECTIVE DATE - 09/02/2019  PLAN DEDUCTIBLE - $2500.00 W/ $4114.64 REMAINING OUT OF POCKET - $3000.00 W/ $3000.00 REMAINING COPAY $0.00 COINSURANCE - 30% PER SERVICE YEAR  NO AUTH REQUIRED PER WEBSITE

## 2019-11-05 ENCOUNTER — Other Ambulatory Visit: Payer: Self-pay | Admitting: Podiatry

## 2019-11-05 DIAGNOSIS — M25774 Osteophyte, right foot: Secondary | ICD-10-CM | POA: Diagnosis not present

## 2019-11-05 DIAGNOSIS — M9271 Juvenile osteochondrosis of metatarsus, right foot: Secondary | ICD-10-CM | POA: Diagnosis not present

## 2019-11-05 DIAGNOSIS — M19071 Primary osteoarthritis, right ankle and foot: Secondary | ICD-10-CM | POA: Diagnosis not present

## 2019-11-05 MED ORDER — GABAPENTIN 300 MG PO CAPS
300.0000 mg | ORAL_CAPSULE | Freq: Three times a day (TID) | ORAL | 0 refills | Status: DC
Start: 2019-11-05 — End: 2020-03-08

## 2019-11-05 MED ORDER — NAPROXEN 500 MG PO TABS
500.0000 mg | ORAL_TABLET | Freq: Two times a day (BID) | ORAL | 0 refills | Status: AC
Start: 2019-11-05 — End: 2019-11-19

## 2019-11-05 MED ORDER — OXYCODONE HCL 5 MG PO TABS: 5.0000 mg | ORAL_TABLET | ORAL | 0 refills | Status: AC | PRN

## 2019-11-05 MED ORDER — ACETAMINOPHEN 500 MG PO TABS: 1000.0000 mg | ORAL_TABLET | Freq: Four times a day (QID) | ORAL | 0 refills | Status: AC | PRN

## 2019-11-05 NOTE — Progress Notes (Signed)
[place] 11/05/19 R 3rd MTP arthroplasty with implant

## 2019-11-11 ENCOUNTER — Other Ambulatory Visit: Payer: Self-pay

## 2019-11-11 ENCOUNTER — Ambulatory Visit (INDEPENDENT_AMBULATORY_CARE_PROVIDER_SITE_OTHER): Payer: BC Managed Care – PPO | Admitting: Podiatry

## 2019-11-11 ENCOUNTER — Ambulatory Visit (INDEPENDENT_AMBULATORY_CARE_PROVIDER_SITE_OTHER): Payer: BC Managed Care – PPO

## 2019-11-11 ENCOUNTER — Other Ambulatory Visit: Payer: Self-pay | Admitting: Podiatry

## 2019-11-11 DIAGNOSIS — M1990 Unspecified osteoarthritis, unspecified site: Secondary | ICD-10-CM | POA: Diagnosis not present

## 2019-11-11 DIAGNOSIS — Z9889 Other specified postprocedural states: Secondary | ICD-10-CM

## 2019-11-11 DIAGNOSIS — M79671 Pain in right foot: Secondary | ICD-10-CM

## 2019-11-11 DIAGNOSIS — M19079 Primary osteoarthritis, unspecified ankle and foot: Secondary | ICD-10-CM

## 2019-11-11 DIAGNOSIS — M9271 Juvenile osteochondrosis of metatarsus, right foot: Secondary | ICD-10-CM

## 2019-11-11 NOTE — Progress Notes (Signed)
  Subjective:  Patient ID: Brian Morrison, male    DOB: 18-Jan-1966,  MRN: [id number]  Chief Complaint  Patient presents with  . Routine Post Op    Pt stated he has some pain, Has no concerns. denies fever/chills nausea and vomittng     DOS: 11/11/19 Procedure: left 3rd MTPJ arthroplasty with implant  54 y.o. male returns for post-op check. Doing well, minimal pain.  Review of Systems: Negative except as noted in the HPI. Denies N/V/F/Ch.   Objective:  There were no vitals filed for this visit. There is no height or weight on file to calculate BMI. Constitutional Well developed. Well nourished.  Vascular Foot warm and well perfused. Capillary refill normal to all digits.   Neurologic Normal speech. Oriented to person, place, and time. Epicritic sensation to light touch grossly present bilaterally.  Dermatologic Skin healing well without signs of infection. Skin edges well coapted without signs of infection.  Orthopedic: Mild tenderness to palpation noted about the surgical site. Good early ROM of the 3rd MTPJ noted   Radiographs: implant in good position Assessment:   1. Foot pain, right   2. Freiberg's disease, right   3. Arthritis of metatarsophalangeal joint   4. Post-operative state    Plan:  Patient was evaluated and treated and all questions answered.  S/p foot surgery left -Progressing as expected post-operatively. -XR: consistent with post operative changes -WB Status: WBAT in CAM -Sutures: absorbable stitches left intact with steri strips. -Foot redressed. -Next visit will remove any remaining stitches and apply new steris, can begin bathing and resume regular shoe gear then -Begin ROM exercises today, demonstrated to him  Return in about 1 week (around 11/18/2019) for post op.

## 2019-11-18 ENCOUNTER — Other Ambulatory Visit: Payer: Self-pay

## 2019-11-18 ENCOUNTER — Ambulatory Visit (INDEPENDENT_AMBULATORY_CARE_PROVIDER_SITE_OTHER): Payer: BC Managed Care – PPO | Admitting: Podiatry

## 2019-11-18 DIAGNOSIS — M9271 Juvenile osteochondrosis of metatarsus, right foot: Secondary | ICD-10-CM

## 2019-11-18 DIAGNOSIS — M79671 Pain in right foot: Secondary | ICD-10-CM

## 2019-11-18 DIAGNOSIS — Z9889 Other specified postprocedural states: Secondary | ICD-10-CM

## 2019-11-18 DIAGNOSIS — M19079 Primary osteoarthritis, unspecified ankle and foot: Secondary | ICD-10-CM

## 2019-11-20 NOTE — Progress Notes (Signed)
  Subjective:  Patient ID: Brian Morrison, male    DOB: Jan 08, 1966,  MRN: [id number]  Chief Complaint  Patient presents with  . Routine Post Op     POV #2 DOS 11/05/2019 ARTHROPLASTY 3RD MTPJ RT W/ IMPLANT    DOS: 11/11/19 Procedure: left 3rd MTPJ arthroplasty with implant  54 y.o. male returns for post-op check. Doing well, minimal pain.  Review of Systems: Negative except as noted in the HPI. Denies N/V/F/Ch.   Objective:  There were no vitals filed for this visit. There is no height or weight on file to calculate BMI. Constitutional Well developed. Well nourished.  Vascular Foot warm and well perfused. Capillary refill normal to all digits.   Neurologic Normal speech. Oriented to person, place, and time. Epicritic sensation to light touch grossly present bilaterally.  Dermatologic Skin incision well healed without signs of infection.   Orthopedic:  Minimal tenderness to palpation noted about the surgical site.  Minimal edema.  Good early ROM of the 3rd MTPJ noted    Assessment:   1. Foot pain, right   2. Freiberg's disease, right   3. Arthritis of metatarsophalangeal joint   4. Post-operative state    Plan:  Patient was evaluated and treated and all questions answered.  S/p foot surgery left -Progressing as expected post-operatively. -WB Status: Can transition to WBAT in regular shoes -Sutures: Healing well with absorbable sutures.  New Steri-Strips were applied today.  He may begin bathing -Continue ROM exercises , demonstrated to him  Return in about 4 weeks (around 12/16/2019).

## 2019-12-02 ENCOUNTER — Encounter: Payer: BC Managed Care – PPO | Admitting: Podiatry

## 2019-12-16 ENCOUNTER — Other Ambulatory Visit: Payer: Self-pay

## 2019-12-16 ENCOUNTER — Encounter: Payer: Self-pay | Admitting: Podiatry

## 2019-12-16 ENCOUNTER — Ambulatory Visit (INDEPENDENT_AMBULATORY_CARE_PROVIDER_SITE_OTHER): Payer: BC Managed Care – PPO | Admitting: Podiatry

## 2019-12-16 DIAGNOSIS — M19079 Primary osteoarthritis, unspecified ankle and foot: Secondary | ICD-10-CM

## 2019-12-16 DIAGNOSIS — Z9889 Other specified postprocedural states: Secondary | ICD-10-CM

## 2019-12-16 DIAGNOSIS — M79671 Pain in right foot: Secondary | ICD-10-CM

## 2019-12-16 DIAGNOSIS — M9271 Juvenile osteochondrosis of metatarsus, right foot: Secondary | ICD-10-CM

## 2019-12-16 NOTE — Progress Notes (Signed)
  Subjective:  Patient ID: Brian Morrison, male    DOB: 1965/07/28,  MRN: [id number]  Chief Complaint  Patient presents with  . Routine Post Op    PT stated that he is doing good and has no concerns he has very little pain that comes and goes     DOS: 11/11/19 Procedure: left 3rd MTPJ arthroplasty with implant  54 y.o. male returns for post-op check. Doing well, minimal pain.  Review of Systems: Negative except as noted in the HPI. Denies N/V/F/Ch.   Objective:  There were no vitals filed for this visit. There is no height or weight on file to calculate BMI. Constitutional Well developed. Well nourished.  Vascular Foot warm and well perfused. Capillary refill normal to all digits.   Neurologic Normal speech. Oriented to person, place, and time. Epicritic sensation to light touch grossly present bilaterally.  Dermatologic Skin incision well healed without signs of infection.   Orthopedic:  No pain today. Good ROM of MTPJ    Assessment:   1. Foot pain, right   2. Freiberg's disease, right   3. Arthritis of metatarsophalangeal joint   4. Post-operative state    Plan:  Patient was evaluated and treated and all questions answered.  S/p foot surgery left -Progressing as expected post-operatively. -WB Status: Ok to resume full activity. Slowly increase exercise level, OK to begin light jogging -New final postoperative x-rays at last visit unless issues sooner  Return in about 2 months (around 02/15/2020) for new x-ray next visit.

## 2019-12-20 DIAGNOSIS — Z1159 Encounter for screening for other viral diseases: Secondary | ICD-10-CM | POA: Diagnosis not present

## 2020-01-04 ENCOUNTER — Other Ambulatory Visit: Payer: Self-pay

## 2020-01-04 ENCOUNTER — Ambulatory Visit (INDEPENDENT_AMBULATORY_CARE_PROVIDER_SITE_OTHER): Payer: BC Managed Care – PPO | Admitting: Family Medicine

## 2020-01-04 DIAGNOSIS — Z23 Encounter for immunization: Secondary | ICD-10-CM

## 2020-02-02 ENCOUNTER — Other Ambulatory Visit: Payer: Self-pay | Admitting: Family Medicine

## 2020-02-02 DIAGNOSIS — J309 Allergic rhinitis, unspecified: Secondary | ICD-10-CM

## 2020-02-15 ENCOUNTER — Other Ambulatory Visit: Payer: Self-pay

## 2020-02-15 ENCOUNTER — Ambulatory Visit (INDEPENDENT_AMBULATORY_CARE_PROVIDER_SITE_OTHER): Payer: BC Managed Care – PPO | Admitting: Podiatry

## 2020-02-15 ENCOUNTER — Ambulatory Visit (INDEPENDENT_AMBULATORY_CARE_PROVIDER_SITE_OTHER): Payer: BC Managed Care – PPO

## 2020-02-15 DIAGNOSIS — M19071 Primary osteoarthritis, right ankle and foot: Secondary | ICD-10-CM

## 2020-02-15 DIAGNOSIS — M19079 Primary osteoarthritis, unspecified ankle and foot: Secondary | ICD-10-CM

## 2020-02-17 ENCOUNTER — Encounter: Payer: Self-pay | Admitting: Podiatry

## 2020-02-17 NOTE — Progress Notes (Signed)
  Subjective:  Patient ID: Brian Morrison, male    DOB: May 20, 1965,  MRN: [id number]  Chief Complaint  Patient presents with  . Routine Post Op    Pt stated that he is doing well he has no concerns at this time, he does still have a little bit of pain that comes and goes.    DOS: 11/11/19 Procedure: left 3rd MTPJ arthroplasty with implant  54 y.o. male returns for post-op check. Doing well, occasional pain, some stiffness.  He is able to run without significant issues.  Review of Systems: Negative except as noted in the HPI. Denies N/V/F/Ch.   Objective:  There were no vitals filed for this visit. There is no height or weight on file to calculate BMI. Constitutional Well developed. Well nourished.  Vascular Foot warm and well perfused. Capillary refill normal to all digits.   Neurologic Normal speech. Oriented to person, place, and time. Epicritic sensation to light touch grossly present bilaterally.  Dermatologic Skin incision well healed without signs of infection.   Orthopedic:  No pain today. Good ROM of MTPJ   Radiographs of right foot: Implant is well seated with no change in position Assessment:   1. Arthritis of metatarsophalangeal joint    Plan:  Patient was evaluated and treated and all questions answered.  S/p foot surgery left -Progressing as expected post-operatively. -WB Status: Ok to resume full activity. Slowly increase exercise level, OK to begin light jogging -This point I will discharge him from care.  I expect his range of motion and pain to continue to improve.  Return if issues  Return if symptoms worsen or fail to improve.

## 2020-02-19 DIAGNOSIS — Z23 Encounter for immunization: Secondary | ICD-10-CM | POA: Diagnosis not present

## 2020-03-08 ENCOUNTER — Ambulatory Visit (INDEPENDENT_AMBULATORY_CARE_PROVIDER_SITE_OTHER): Payer: BC Managed Care – PPO | Admitting: Family Medicine

## 2020-03-08 ENCOUNTER — Other Ambulatory Visit: Payer: Self-pay

## 2020-03-08 ENCOUNTER — Encounter: Payer: Self-pay | Admitting: Family Medicine

## 2020-03-08 VITALS — BP 129/81 | HR 52 | Temp 98.9°F | Ht 73.0 in | Wt 194.0 lb

## 2020-03-08 DIAGNOSIS — R062 Wheezing: Secondary | ICD-10-CM

## 2020-03-08 DIAGNOSIS — R351 Nocturia: Secondary | ICD-10-CM | POA: Diagnosis not present

## 2020-03-08 DIAGNOSIS — N401 Enlarged prostate with lower urinary tract symptoms: Secondary | ICD-10-CM | POA: Diagnosis not present

## 2020-03-08 DIAGNOSIS — Z0000 Encounter for general adult medical examination without abnormal findings: Secondary | ICD-10-CM

## 2020-03-08 DIAGNOSIS — Z0001 Encounter for general adult medical examination with abnormal findings: Secondary | ICD-10-CM

## 2020-03-08 DIAGNOSIS — Z131 Encounter for screening for diabetes mellitus: Secondary | ICD-10-CM | POA: Diagnosis not present

## 2020-03-08 DIAGNOSIS — D229 Melanocytic nevi, unspecified: Secondary | ICD-10-CM | POA: Diagnosis not present

## 2020-03-08 DIAGNOSIS — Z130 Encounter for screening for diseases of the blood and blood-forming organs and certain disorders involving the immune mechanism: Secondary | ICD-10-CM

## 2020-03-08 DIAGNOSIS — Z13220 Encounter for screening for lipoid disorders: Secondary | ICD-10-CM | POA: Diagnosis not present

## 2020-03-08 DIAGNOSIS — Z1329 Encounter for screening for other suspected endocrine disorder: Secondary | ICD-10-CM

## 2020-03-08 DIAGNOSIS — J4520 Mild intermittent asthma, uncomplicated: Secondary | ICD-10-CM

## 2020-03-08 LAB — POCT URINALYSIS DIP (MANUAL ENTRY)
Bilirubin, UA: NEGATIVE
Blood, UA: NEGATIVE
Glucose, UA: NEGATIVE mg/dL
Ketones, POC UA: NEGATIVE mg/dL
Leukocytes, UA: NEGATIVE
Nitrite, UA: NEGATIVE
Protein Ur, POC: NEGATIVE mg/dL
Spec Grav, UA: 1.015 (ref 1.010–1.025)
Urobilinogen, UA: 0.2 E.U./dL
pH, UA: 6.5 (ref 5.0–8.0)

## 2020-03-08 MED ORDER — ALBUTEROL SULFATE HFA 108 (90 BASE) MCG/ACT IN AERS: 1.0000 | INHALATION_SPRAY | RESPIRATORY_TRACT | 0 refills | Status: DC | PRN

## 2020-03-08 MED ORDER — TAMSULOSIN HCL 0.4 MG PO CAPS
0.4000 mg | ORAL_CAPSULE | Freq: Every day | ORAL | 3 refills | Status: DC
Start: 2020-03-08 — End: 2020-04-19

## 2020-03-08 NOTE — Patient Instructions (Addendum)
I will refer you to dermatology.  Prostate test and urine test today. If PSA is elevated would recommend urology follow-up.  However for now can treat for possible enlarged prostate with Flomax 1 pill once per day.  Watch for lightheadedness or dizziness on that medication but let me know if you have any new side effects.  Recheck in 6 weeks to discuss those symptoms further.  Albuterol refilled if needed, continue Flonase for allergies.  Thank you for coming in today and let me know if there are questions.    Keeping you healthy  Get these tests  Blood pressure- Have your blood pressure checked once a year by your healthcare provider.  Normal blood pressure is 120/80  Weight- Have your body mass index (BMI) calculated to screen for obesity.  BMI is a measure of body fat based on height and weight. You can also calculate your own BMI at [email or web address].  Cholesterol- Have your cholesterol checked every year.  Diabetes- Have your blood sugar checked regularly if you have high blood pressure, high cholesterol, have a family history of diabetes or if you are overweight.  Screening for Colon Cancer- Colonoscopy starting at age 74.  Screening may begin sooner depending on your family history and other health conditions. Follow up colonoscopy as directed by your Gastroenterologist.  Screening for Prostate Cancer- Both blood work (PSA) and a rectal exam help screen for Prostate Cancer.  Screening begins at age 30 with African-American men and at age 59 with Caucasian men.  Screening may begin sooner depending on your family history.  Take these medicines  Aspirin- One aspirin daily can help prevent Heart disease and Stroke.  Flu shot- Every fall.  Tetanus- Every 10 years.  Zostavax- Once after the age of 30 to prevent Shingles.  Pneumonia shot- Once after the age of 52; if you are younger than 78, ask your healthcare provider if you need a Pneumonia shot.  Take these steps  Don't  smoke- If you do smoke, talk to your doctor about quitting.  For tips on how to quit, go to www.smokefree.gov or call 1-800-QUIT-NOW.  Be physically active- Exercise 5 days a week for at least 30 minutes.  If you are not already physically active start slow and gradually work up to 30 minutes of moderate physical activity.  Examples of moderate activity include walking briskly, mowing the yard, dancing, swimming, bicycling, etc.  Eat a healthy diet- Eat a variety of healthy food such as fruits, vegetables, low fat milk, low fat cheese, yogurt, lean meant, poultry, fish, beans, tofu, etc. For more information go to www.thenutritionsource.org  Drink alcohol in moderation- Limit alcohol intake to less than two drinks a day. Never drink and drive.  Dentist- Brush and floss twice daily; visit your dentist twice a year.  Depression- Your emotional health is as important as your physical health. If you're feeling down, or losing interest in things you would normally enjoy please talk to your healthcare provider.  Eye exam- Visit your eye doctor every year.  Safe sex- If you may be exposed to a sexually transmitted infection, use a condom.  Seat belts- Seat belts can save your life; always wear one.  Smoke/Carbon Monoxide detectors- These detectors need to be installed on the appropriate level of your home.  Replace batteries at least once a year.  Skin cancer- When out in the sun, cover up and use sunscreen 15 SPF or higher.  Violence- If anyone is threatening you, please tell your healthcare  provider.  Living Will/ Health care power of attorney- Speak with your healthcare provider and family.   If you have lab work done today you will be contacted with your lab results within the next 2 weeks.  If you have not heard from [place] then please contact us. The fastest way to get your results is to register for My Chart.   IF you received an x-ray today, you will receive an invoice from [facility]  Radiology. Please contact [facility] at [phone] with questions or concerns regarding your invoice.   IF you received labwork today, you will receive an invoice from [facility]. Please contact [facility] at [phone] with questions or concerns regarding your invoice.   Our billing staff will not be able to assist you with questions regarding bills from these companies.  You will be contacted with the lab results as soon as they are available. The fastest way to get your results is to activate your My Chart account. Instructions are located on the last page of this paperwork. If you have not heard from [place] regarding the results in 2 weeks, please contact this office.

## 2020-03-08 NOTE — Progress Notes (Unsigned)
Subjective:  Patient ID: Brian Morrison, male    DOB: 1966-02-05  Age: 55 y.o. MRN: [id number]  CC:  Chief Complaint  Patient presents with  . Annual Exam    Pt reports he feels well with, but is concerned that he may have an enlarged prostate due to frequent nocturnal urination.    HPI Brian Morrison presents for   Annual exam and concerns as above.  Nocturia: 4-5 episodes per night, past few years. Incomplete emptying. Hesitancy, but no retention. Urinary frequency- every few hours.  No otc treatments.  No prostate ca in family.  The natural history of prostate cancer and ongoing controversy regarding screening and potential treatment outcomes of prostate cancer has been discussed with the patient. The meaning of a false positive PSA and a false negative PSA has been discussed. He indicates understanding of the limitations of this screening test and wishes to proceed with screening PSA testing. No prior urology eval.   Lab Results  Component Value Date   PSA 0.46 06/28/2014   Asthma: Mild intermittent with albuterol as needed.  History of allergic rhinitis. No recent flair - has albuterol if needed.  flonase nasal spray daily. Working well. Insurance will not cover meds.   Depression screen [facility] 2/9 03/08/2020 09/17/2019 12/04/2017 11/28/2017 10/31/2017  Decreased Interest 0 0 0 0 0  Down, Depressed, Hopeless 0 0 0 0 0  PHQ - 2 Score 0 0 0 0 0   Cancer screening: Colonoscopy 02/09/2019, repeat 5 years Prostate as above.  No dermatologist. Few moles on head and stomach - may have changed.  HIV/hepatitis C screening discussed, deferred.  Immunization History  Administered Date(s) Administered  . Influenza,inj,Quad PF,6+ Mos 01/15/2019, 01/04/2020  . PFIZER SARS-COV-2 Vaccination 05/15/2019, 06/05/2019  . Pneumococcal Polysaccharide-23 01/15/2019  . Tdap 09/17/2019  COVID-19 vaccine: pfizer x2, with Pfizer booster few weeks ago - 02/19/20. Shingles vaccine: will think  about it for now.    Hearing Screening   125Hz  250Hz  500Hz  1000Hz  2000Hz  3000Hz  4000Hz  6000Hz  8000Hz   Right ear:           Left ear:             Visual Acuity Screening   Right eye Left eye Both eyes  Without correction:     With correction: 20/20 20/20 20/13   optho - about 1.5 yrs ago. Contact lenses  Dental: yearly.   Exercise: run every other day, other exercise at home 6 days per week.   Coffee with light cream/sugar this am.    History Patient Active Problem List   Diagnosis Date Noted  . Ingrown toenail of right foot 09/17/2019  . Morton's neuroma of right foot 09/17/2019  . Asthma with exacerbation 07/18/2015  . Allergic rhinitis 07/18/2015  . Depression with anxiety 12/04/2011   Past Medical History:  Diagnosis Date  . Allergic rhinitis   . Allergy   . Anxiety   . Asthma   . Depression    Past Surgical History:  Procedure Laterality Date  . JOINT REPLACEMENT     R foot 3rd meditarsel   No Known Allergies Prior to Admission medications   Medication Sig Start Date End Date Taking? Authorizing Provider  fluticasone (FLONASE) 50 MCG/ACT nasal spray SHAKE LIQUID AND USE 2 SPRAYS IN EACH NOSTRIL EVERY DAY 02/02/20  Yes Wendie Agreste, MD  albuterol (VENTOLIN HFA) 108 (90 Base) MCG/ACT inhaler Inhale 1-2 puffs into the lungs every 4 (four) hours as needed for wheezing or shortness of  breath. Patient not taking: Reported on 03/08/2020 01/14/19   Wendie Agreste, MD  gabapentin (NEURONTIN) 300 MG capsule Take 1 capsule (300 mg total) by mouth 3 (three) times daily for 7 days. 11/05/19 11/12/19  Criselda Peaches, DPM  neomycin-polymyxin-hydrocortisone (CORTISPORIN) OTIC solution Apply 1-2 drops to toe after soaking twice a day 09/21/19   Criselda Peaches, DPM   Social History   Socioeconomic History  . Marital status: Married    Spouse name: Not on file  . Number of children: 4  . Years of education: College  . Highest education level: Not on file  Occupational  History  . Not on file  Tobacco Use  . Smoking status: Former Smoker    Years: 10.00    Start date: 12/03/1997    Quit date: 1999    Years since quitting: 23.0  . Smokeless tobacco: Never Used  Vaping Use  . Vaping Use: Never used  Substance and Sexual Activity  . Alcohol use: No    Alcohol/week: 0.0 standard drinks  . Drug use: No  . Sexual activity: Yes  Other Topics Concern  . Not on file  Social History Narrative   Drinks about 1 large cup of coffee a day    Social Determinants of Health   Financial Resource Strain: Not on file  Food Insecurity: Not on file  Transportation Needs: Not on file  Physical Activity: Not on file  Stress: Not on file  Social Connections: Not on file  Intimate Partner Violence: Not on file    Review of Systems 13 point review of systems per patient health survey noted.  Negative other than as indicated above or in HPI.    Objective:   Vitals:   03/08/20 1106  BP: 129/81  Pulse: (!) 52  Temp: 98.9 F (37.2 C)  TempSrc: Temporal  SpO2: 98%  Weight: 194 lb (88 kg)  Height: 6\' 1"  (1.854 m)     Physical Exam Vitals reviewed.  Constitutional:      Appearance: He is well-developed.  HENT:     Head: Normocephalic and atraumatic.     Right Ear: External ear normal.     Left Ear: External ear normal.  Eyes:     Conjunctiva/sclera: Conjunctivae normal.     Pupils: Pupils are equal, round, and reactive to light.  Neck:     Thyroid: No thyromegaly.  Cardiovascular:     Rate and Rhythm: Normal rate and regular rhythm.     Heart sounds: Normal heart sounds.  Pulmonary:     Effort: Pulmonary effort is normal. No respiratory distress.     Breath sounds: Normal breath sounds. No wheezing.  Abdominal:     General: There is no distension.     Palpations: Abdomen is soft.     Tenderness: There is no abdominal tenderness.     Hernia: There is no hernia in the left inguinal area or right inguinal area.  Genitourinary:    Prostate:  Enlarged (min enlarged, no apparent nodules, nontender. ). Not tender and no nodules present.  Musculoskeletal:        General: No tenderness. Normal range of motion.     Cervical back: Normal range of motion and neck supple.  Lymphadenopathy:     Cervical: No cervical adenopathy.  Skin:    General: Skin is warm and dry.       Neurological:     Mental Status: He is alert and oriented to person, place, and time.  Deep Tendon Reflexes: Reflexes are normal and symmetric.  Psychiatric:        Behavior: Behavior normal.     Assessment & Plan:  Brian Morrison is a 55 y.o. male . Annual physical exam  - -anticipatory guidance as below in AVS, screening labs above. Health maintenance items as above in HPI discussed/recommended as applicable.   Reactive airway disease, mild intermittent, uncomplicated - Plan: albuterol (VENTOLIN HFA) 108 (90 Base) MCG/ACT inhaler Wheezing - Plan: albuterol (VENTOLIN HFA) 108 (90 Base) MCG/ACT inhaler  -Stable, albuterol refilled if needed  Multiple nevi - Plan: Ambulatory referral to Dermatology  -Stable scalp lesions, 1 slightly hypopigmented area on lateral lesion but appear overall benign.  Few scattered nevi on trunk, lower left abdomen likely seborrheic keratosis.  Refer to dermatology.  Benign prostatic hyperplasia with nocturia - Plan: PSA, POCT urinalysis dipstick, tamsulosin (FLOMAX) 0.4 MG CAPS capsule  -Suspected BPH with nocturia, frequency, hesitancy, incomplete emptying.  Check PSA, if elevated would recommend urology evaluation.  Trial of Flomax at this time with possible side effects discussed  Screening for hyperlipidemia - Plan: Lipid panel  Screening for thyroid disorder - Plan: TSH  Screening, anemia, deficiency, iron - Plan: CBC  Screening for diabetes mellitus - Plan: Comprehensive metabolic panel   Meds ordered this encounter  Medications  . albuterol (VENTOLIN HFA) 108 (90 Base) MCG/ACT inhaler    Sig: Inhale 1-2 puffs  into the lungs every 4 (four) hours as needed for wheezing or shortness of breath.    Dispense:  18 g    Refill:  0  . tamsulosin (FLOMAX) 0.4 MG CAPS capsule    Sig: Take 1 capsule (0.4 mg total) by mouth daily.    Dispense:  30 capsule    Refill:  3   Patient Instructions   I will refer you to dermatology.  Prostate test and urine test today. If PSA is elevated would recommend urology follow-up.  However for now can treat for possible enlarged prostate with Flomax 1 pill once per day.  Watch for lightheadedness or dizziness on that medication but let me know if you have any new side effects.  Recheck in 6 weeks to discuss those symptoms further.  Albuterol refilled if needed, continue Flonase for allergies.  Thank you for coming in today and let me know if there are questions.    Keeping you healthy  Get these tests  Blood pressure- Have your blood pressure checked once a year by your healthcare provider.  Normal blood pressure is 120/80  Weight- Have your body mass index (BMI) calculated to screen for obesity.  BMI is a measure of body fat based on height and weight. You can also calculate your own BMI at [email or web address].  Cholesterol- Have your cholesterol checked every year.  Diabetes- Have your blood sugar checked regularly if you have high blood pressure, high cholesterol, have a family history of diabetes or if you are overweight.  Screening for Colon Cancer- Colonoscopy starting at age 58.  Screening may begin sooner depending on your family history and other health conditions. Follow up colonoscopy as directed by your Gastroenterologist.  Screening for Prostate Cancer- Both blood work (PSA) and a rectal exam help screen for Prostate Cancer.  Screening begins at age 24 with African-American men and at age 66 with Caucasian men.  Screening may begin sooner depending on your family history.  Take these medicines  Aspirin- One aspirin daily can help prevent Heart disease  and Stroke.  Flu shot-  Every fall.  Tetanus- Every 10 years.  Zostavax- Once after the age of 73 to prevent Shingles.  Pneumonia shot- Once after the age of 85; if you are younger than 76, ask your healthcare provider if you need a Pneumonia shot.  Take these steps  Don't smoke- If you do smoke, talk to your doctor about quitting.  For tips on how to quit, go to www.smokefree.gov or call 1-800-QUIT-NOW.  Be physically active- Exercise 5 days a week for at least 30 minutes.  If you are not already physically active start slow and gradually work up to 30 minutes of moderate physical activity.  Examples of moderate activity include walking briskly, mowing the yard, dancing, swimming, bicycling, etc.  Eat a healthy diet- Eat a variety of healthy food such as fruits, vegetables, low fat milk, low fat cheese, yogurt, lean meant, poultry, fish, beans, tofu, etc. For more information go to www.thenutritionsource.org  Drink alcohol in moderation- Limit alcohol intake to less than two drinks a day. Never drink and drive.  Dentist- Brush and floss twice daily; visit your dentist twice a year.  Depression- Your emotional health is as important as your physical health. If you're feeling down, or losing interest in things you would normally enjoy please talk to your healthcare provider.  Eye exam- Visit your eye doctor every year.  Safe sex- If you may be exposed to a sexually transmitted infection, use a condom.  Seat belts- Seat belts can save your life; always wear one.  Smoke/Carbon Monoxide detectors- These detectors need to be installed on the appropriate level of your home.  Replace batteries at least once a year.  Skin cancer- When out in the sun, cover up and use sunscreen 15 SPF or higher.  Violence- If anyone is threatening you, please tell your healthcare provider.  Living Will/ Health care power of attorney- Speak with your healthcare provider and family.   If you have lab work  done today you will be contacted with your lab results within the next 2 weeks.  If you have not heard from [place] then please contact us. The fastest way to get your results is to register for My Chart.   IF you received an x-ray today, you will receive an invoice from [facility]. Please contact [facility] at [phone] with questions or concerns regarding your invoice.   IF you received labwork today, you will receive an invoice from [facility]. Please contact [facility] at [phone] with questions or concerns regarding your invoice.   Our billing staff will not be able to assist you with questions regarding bills from these companies.  You will be contacted with the lab results as soon as they are available. The fastest way to get your results is to activate your My Chart account. Instructions are located on the last page of this paperwork. If you have not heard from [place] regarding the results in 2 weeks, please contact this office.         Signed, Merri Ray, MD [facility]

## 2020-03-09 LAB — COMPREHENSIVE METABOLIC PANEL
ALT: 17 IU/L (ref 0–44)
AST: 18 IU/L (ref 0–40)
Albumin/Globulin Ratio: 2.2 (ref 1.2–2.2)
Albumin: 4.9 g/dL (ref 3.8–4.9)
Alkaline Phosphatase: 64 IU/L (ref 44–121)
BUN/Creatinine Ratio: 11 (ref 9–20)
BUN: 12 mg/dL (ref 6–24)
Bilirubin Total: 0.9 mg/dL (ref 0.0–1.2)
CO2: 22 mmol/L (ref 20–29)
Calcium: 9.1 mg/dL (ref 8.7–10.2)
Chloride: 102 mmol/L (ref 96–106)
Creatinine, Ser: 1.07 mg/dL (ref 0.76–1.27)
GFR calc Af Amer: 92 mL/min/{1.73_m2} (ref >59)
GFR calc non Af Amer: 79 mL/min/{1.73_m2} (ref >59)
Globulin, Total: 2.2 g/dL (ref 1.5–4.5)
Glucose: 91 mg/dL (ref 65–99)
Potassium: 4.4 mmol/L (ref 3.5–5.2)
Sodium: 139 mmol/L (ref 134–144)
Total Protein: 7.1 g/dL (ref 6.0–8.5)

## 2020-03-09 LAB — LIPID PANEL
Chol/HDL Ratio: 3.8 ratio (ref 0.0–5.0)
Cholesterol, Total: 197 mg/dL (ref 100–199)
HDL: 52 mg/dL (ref >39)
LDL Chol Calc (NIH): 122 mg/dL — ABNORMAL HIGH (ref 0–99)
Triglycerides: 127 mg/dL (ref 0–149)
VLDL Cholesterol Cal: 23 mg/dL (ref 5–40)

## 2020-03-09 LAB — CBC
Hematocrit: 46 % (ref 37.5–51.0)
Hemoglobin: 15.8 g/dL (ref 13.0–17.7)
MCH: 30.8 pg (ref 26.6–33.0)
MCHC: 34.3 g/dL (ref 31.5–35.7)
MCV: 90 fL (ref 79–97)
Platelets: 233 10*3/uL (ref 150–450)
RBC: 5.13 x10E6/uL (ref 4.14–5.80)
RDW: 12.2 % (ref 11.6–15.4)
WBC: 5.9 10*3/uL (ref 3.4–10.8)

## 2020-03-09 LAB — PSA: Prostate Specific Ag, Serum: 0.5 ng/mL (ref 0.0–4.0)

## 2020-03-09 LAB — TSH: TSH: 1.77 u[IU]/mL (ref 0.450–4.500)

## 2020-04-19 ENCOUNTER — Ambulatory Visit: Payer: BC Managed Care – PPO | Admitting: Family Medicine

## 2020-04-19 ENCOUNTER — Other Ambulatory Visit: Payer: Self-pay

## 2020-04-19 ENCOUNTER — Encounter: Payer: Self-pay | Admitting: Family Medicine

## 2020-04-19 VITALS — BP 125/81 | HR 52 | Temp 98.7°F | Ht 73.0 in | Wt 194.0 lb

## 2020-04-19 DIAGNOSIS — N401 Enlarged prostate with lower urinary tract symptoms: Secondary | ICD-10-CM | POA: Diagnosis not present

## 2020-04-19 DIAGNOSIS — R351 Nocturia: Secondary | ICD-10-CM | POA: Diagnosis not present

## 2020-04-19 DIAGNOSIS — R399 Unspecified symptoms and signs involving the genitourinary system: Secondary | ICD-10-CM

## 2020-04-19 MED ORDER — TAMSULOSIN HCL 0.4 MG PO CAPS
0.4000 mg | ORAL_CAPSULE | Freq: Every day | ORAL | 1 refills | Status: DC
Start: 2020-04-19 — End: 2020-10-09

## 2020-04-19 NOTE — Progress Notes (Signed)
Subjective:  Patient ID: Brian Morrison, male    DOB: [birth year]  Age: 55 y.o. MRN: [id number]  CC:  Chief Complaint  Patient presents with  . Follow-up    On recent lab work. No issues to discuss.    HPI Abe Schools presents for  Last seen January 5 for physical.  BPH with nocturia Discussed in January.  Suspected BPH with nocturia, frequency, hesitancy, incomplete emptying.  Flomax 0.4 mg started.  PSA similar to 2016. Feels like better - 1-2 episodes nocturia (prior 3-4). Able to wait longer prior to going to restroom. Less hesitancy.  Would like to remain on flomax for now. No side effects.  No hematuria, or dysuria.  No unexplained wt loss, night sweats.  Lab Results  Component Value Date   PSA1 0.5 03/08/2020   PSA 0.46 06/28/2014      History Patient Active Problem List   Diagnosis Date Noted  . Ingrown toenail of right foot 09/17/2019  . Morton's neuroma of right foot 09/17/2019  . Asthma with exacerbation 07/18/2015  . Allergic rhinitis 07/18/2015  . Depression with anxiety 12/04/2011   Past Medical History:  Diagnosis Date  . Allergic rhinitis   . Allergy   . Anxiety   . Asthma   . Depression    Past Surgical History:  Procedure Laterality Date  . JOINT REPLACEMENT     R foot 3rd meditarsel   No Known Allergies Prior to Admission medications   Medication Sig Start Date End Date Taking? Authorizing Provider  albuterol (VENTOLIN HFA) 108 (90 Base) MCG/ACT inhaler Inhale 1-2 puffs into the lungs every 4 (four) hours as needed for wheezing or shortness of breath. 03/08/20   Wendie Agreste, MD  fluticasone [facility]) 50 MCG/ACT nasal spray SHAKE LIQUID AND USE 2 SPRAYS IN [facility] NOSTRIL EVERY DAY 02/02/20   Wendie Agreste, MD  tamsulosin (FLOMAX) 0.4 MG CAPS capsule Take 1 capsule (0.4 mg total) by mouth daily. 03/08/20   Wendie Agreste, MD   Social History   Socioeconomic History  . Marital status: Married    Spouse name: Not on file  .  Number of children: 4  . Years of education: College  . Highest education level: Not on file  Occupational History  . Not on file  Tobacco Use  . Smoking status: Former Smoker    Years: 10.00    Start date: 12/03/1997    Quit date: 1999    Years since quitting: 23.1  . Smokeless tobacco: Never Used  Vaping Use  . Vaping Use: Never used  Substance and Sexual Activity  . Alcohol use: No    Alcohol/week: 0.0 standard drinks  . Drug use: No  . Sexual activity: Yes  Other Topics Concern  . Not on file  Social History Narrative   Drinks about 1 large cup of coffee a day    Social Determinants of Health   Financial Resource Strain: Not on file  Food Insecurity: Not on file  Transportation Needs: Not on file  Physical Activity: Not on file  Stress: Not on file  Social Connections: Not on file  Intimate Partner Violence: Not on file    Review of Systems Per hpi.   Objective:   Vitals:   04/19/20 0900  BP: 125/81  Pulse: (!) 52  Temp: 98.7 F (37.1 C)  TempSrc: Temporal  SpO2: 98%  Weight: 194 lb (88 kg)  Height: 6\' 1"  (1.854 m)     Physical Exam Vitals  reviewed.  Constitutional:      General: He is not in acute distress.    Appearance: He is well-developed and well-nourished.  HENT:     Head: Normocephalic and atraumatic.  Eyes:     Extraocular Movements: EOM normal.     Pupils: Pupils are equal, round, and reactive to light.  Neck:     Vascular: No carotid bruit or JVD.  Cardiovascular:     Rate and Rhythm: Normal rate and regular rhythm.     Heart sounds: Normal heart sounds. No murmur heard.   Pulmonary:     Effort: Pulmonary effort is normal.     Breath sounds: Normal breath sounds. No rales.  Musculoskeletal:        General: No edema.  Skin:    General: Skin is warm and dry.  Neurological:     Mental Status: He is alert and oriented to person, place, and time.  Psychiatric:        Mood and Affect: Mood and affect and mood normal.         Behavior: Behavior normal.        Thought Content: Thought content normal.     Assessment & Plan:  [name] is a 55 y.o. male . Benign prostatic hyperplasia with nocturia - Plan: tamsulosin (FLOMAX) 0.4 MG CAPS capsule  Symptoms involving urinary system Improved nocturia, other urinary symptoms.  PSA overall stable from previous readings in 2016.  Option of urology evaluation to discuss other treatment options but declined/deferred at this time.  If you would like to meet with urology can place referral without office visit.  Recheck in 5 months.  Other labs from last visit reviewed without concerns.  Meds ordered this encounter  Medications  . tamsulosin (FLOMAX) 0.4 MG CAPS capsule    Sig: Take 1 capsule (0.4 mg total) by mouth daily.    Dispense:  90 capsule    Refill:  1    Ok to place on hold.   Patient Instructions   Ok to continue flomax same dose for now. If you would like to meet with urology to discuss other options, let me know - I am happy to refer you. Let me know.  Other labs look okay last visit.  Follow-up in 5 months but please let me know if there are questions sooner.     If you have lab work done today you will be contacted with your lab results within the next 2 weeks.  If you have not heard from [place] then please contact us. The fastest way to get your results is to register for My Chart.   IF you received an x-ray today, you will receive an invoice from [facility]. Please contact [facility] at [phone] with questions or concerns regarding your invoice.   IF you received labwork today, you will receive an invoice from [place]. Please contact [facility] at [phone] with questions or concerns regarding your invoice.   Our billing staff will not be able to assist you with questions regarding bills from these companies.  You will be contacted with the lab results as soon as they are available. The fastest way to get your results  is to activate your My Chart account. Instructions are located on the last page of this paperwork. If you have not heard from [place] regarding the results in 2 weeks, please contact this office.         Signed, Merri Ray, MD [facility]

## 2020-04-19 NOTE — Patient Instructions (Addendum)
Ok to continue flomax same dose for now. If you would like to meet with urology to discuss other options, let me know - I am happy to refer you. Let me know.  Other labs look okay last visit.  Follow-up in 5 months but please let me know if there are questions sooner.     If you have lab work done today you will be contacted with your lab results within the next 2 weeks.  If you have not heard from [place] then please contact us. The fastest way to get your results is to register for My Chart.   IF you received an x-ray today, you will receive an invoice from [facility]. Please contact [facility] at [phone] with questions or concerns regarding your invoice.   IF you received labwork today, you will receive an invoice from [facility] [place]. Please contact [facility] at [phone] with questions or concerns regarding your invoice.   Our billing staff will not be able to assist you with questions regarding bills from these companies.  You will be contacted with the lab results as soon as they are available. The fastest way to get your results is to activate your My Chart account. Instructions are located on the last page of this paperwork. If you have not heard from [place] regarding the results in 2 weeks, please contact this office.

## 2020-05-15 DIAGNOSIS — S67192A Crushing injury of right middle finger, initial encounter: Secondary | ICD-10-CM | POA: Diagnosis not present

## 2020-07-04 ENCOUNTER — Other Ambulatory Visit: Payer: Self-pay | Admitting: Family Medicine

## 2020-07-04 DIAGNOSIS — N401 Enlarged prostate with lower urinary tract symptoms: Secondary | ICD-10-CM

## 2020-10-08 ENCOUNTER — Other Ambulatory Visit: Payer: Self-pay | Admitting: Family Medicine

## 2020-10-08 DIAGNOSIS — N401 Enlarged prostate with lower urinary tract symptoms: Secondary | ICD-10-CM

## 2020-10-11 ENCOUNTER — Other Ambulatory Visit: Payer: Self-pay | Admitting: Family Medicine

## 2020-10-11 DIAGNOSIS — N401 Enlarged prostate with lower urinary tract symptoms: Secondary | ICD-10-CM

## 2020-12-26 ENCOUNTER — Ambulatory Visit (INDEPENDENT_AMBULATORY_CARE_PROVIDER_SITE_OTHER): Payer: BC Managed Care – PPO | Admitting: Registered Nurse

## 2020-12-26 ENCOUNTER — Other Ambulatory Visit: Payer: Self-pay

## 2020-12-26 DIAGNOSIS — Z23 Encounter for immunization: Secondary | ICD-10-CM | POA: Diagnosis not present

## 2020-12-26 NOTE — Patient Instructions (Signed)
° ° ° °  If you have lab work done today you will be contacted with your lab results within the next 2 weeks.  If you have not heard from us then please contact us. The fastest way to get your results is to register for My Chart. ° ° °IF you received an x-ray today, you will receive an invoice from [facility]. Please contact [facility] at [phone] with questions or concerns regarding your invoice.  ° °IF you received labwork today, you will receive an invoice from [facility]. Please contact [facility] at [phone] with questions or concerns regarding your invoice.  ° °Our billing staff will not be able to assist you with questions regarding bills from these companies. ° °You will be contacted with the lab results as soon as they are available. The fastest way to get your results is to activate your My Chart account. Instructions are located on the last page of this paperwork. If you have not heard from us regarding the results in 2 weeks, please contact this office. °  ° ° ° °

## 2021-01-20 ENCOUNTER — Other Ambulatory Visit: Payer: Self-pay | Admitting: Family Medicine

## 2021-01-20 DIAGNOSIS — R351 Nocturia: Secondary | ICD-10-CM

## 2021-01-20 DIAGNOSIS — N401 Enlarged prostate with lower urinary tract symptoms: Secondary | ICD-10-CM

## 2021-02-01 ENCOUNTER — Encounter: Payer: Self-pay | Admitting: Family Medicine

## 2021-02-01 ENCOUNTER — Ambulatory Visit: Payer: BC Managed Care – PPO | Admitting: Family Medicine

## 2021-02-01 DIAGNOSIS — R351 Nocturia: Secondary | ICD-10-CM | POA: Diagnosis not present

## 2021-02-01 DIAGNOSIS — J4520 Mild intermittent asthma, uncomplicated: Secondary | ICD-10-CM | POA: Diagnosis not present

## 2021-02-01 DIAGNOSIS — R062 Wheezing: Secondary | ICD-10-CM | POA: Diagnosis not present

## 2021-02-01 DIAGNOSIS — N401 Enlarged prostate with lower urinary tract symptoms: Secondary | ICD-10-CM

## 2021-02-01 MED ORDER — TAMSULOSIN HCL 0.4 MG PO CAPS: ORAL_CAPSULE | ORAL | 1 refills | Status: DC

## 2021-02-01 MED ORDER — ALBUTEROL SULFATE HFA 108 (90 BASE) MCG/ACT IN AERS: 1.0000 | INHALATION_SPRAY | RESPIRATORY_TRACT | 0 refills | Status: DC | PRN

## 2021-02-01 NOTE — Patient Instructions (Addendum)
Try higher dose flomax - up to 2 pills per day. Watch for new side effects. If not helpful. I am happy to refer you to urology.  Albuterol refilled if needed.   Physical in 3 months with bloodwork.

## 2021-02-01 NOTE — Progress Notes (Signed)
Subjective:  Patient ID: Brian Morrison, male    DOB: 1965/12/26  Age: 55 y.o. MRN: [id number]  CC:  Chief Complaint  Patient presents with   Urinary Retention    Pt here for refill on flomax today, pt reports works no concerns at this time    HPI Brian Morrison presents for   BPH with nocturia: Started on Flomax in February 0.4 mg.  Improving to 1-2 episodes of nocturia.  Remained on same dose.  Option to meet with urology but declined at that time. Still nocturia 1-2 at night. No side effects. No dry mouth.dizziness.  No hematuria, no abdominal or pelvic pain, no retention, able to urinate just nocturia.  Reactive Airway/asthma No recent need for albuterol, needs up to date Rx.   MW:UXLKG vaccine - 1 booster. Bivalent recommended.  Had flu vaccine.   History Patient Active Problem List   Diagnosis Date Noted   Ingrown toenail of right foot 09/17/2019   Morton's neuroma of right foot 09/17/2019   Asthma with exacerbation 07/18/2015   Allergic rhinitis 07/18/2015   Depression with anxiety 12/04/2011   Past Medical History:  Diagnosis Date   Allergic rhinitis    Allergy    Anxiety    Asthma    Depression    Past Surgical History:  Procedure Laterality Date   JOINT REPLACEMENT     R foot 3rd meditarsel   No Known Allergies Prior to Admission medications   Medication Sig Start Date End Date Taking? Authorizing Provider  albuterol (VENTOLIN HFA) 108 (90 Base) MCG/ACT inhaler Inhale 1-2 puffs into the lungs every 4 (four) hours as needed for wheezing or shortness of breath. 03/08/20  Yes Wendie Agreste, MD  fluticasone [facility]) 50 MCG/ACT nasal spray SHAKE LIQUID AND USE 2 SPRAYS IN EACH NOSTRIL EVERY DAY 02/02/20  Yes Wendie Agreste, MD  tamsulosin (FLOMAX) 0.4 MG CAPS capsule TAKE 1 CAPSULE(0.4 MG) BY MOUTH DAILY 10/09/20  Yes Wendie Agreste, MD   Social History   Socioeconomic History   Marital status: Married    Spouse name: Not on file   Number of  children: 4   Years of education: College   Highest education level: Not on file  Occupational History   Not on file  Tobacco Use   Smoking status: Former    Years: 10.00    Types: Cigarettes    Start date: 12/03/1997    Quit date: 02/1998    Years since quitting: 23.0   Smokeless tobacco: Never  Vaping Use   Vaping Use: Never used  Substance and Sexual Activity   Alcohol use: No    Alcohol/week: 0.0 standard drinks   Drug use: No   Sexual activity: Yes  Other Topics Concern   Not on file  Social History Narrative   Drinks about 1 large cup of coffee a day    Social Determinants of Health   Financial Resource Strain: Not on file  Food Insecurity: Not on file  Transportation Needs: Not on file  Physical Activity: Not on file  Stress: Not on file  Social Connections: Not on file  Intimate Partner Violence: Not on file    Review of Systems Per HPI.   Objective:   Vitals:   02/01/21 1415  BP: 114/68  Pulse: (!) 58  Resp: 16  Temp: 98.2 F (36.8 C)  TempSrc: Temporal  SpO2: 96%  Weight: 189 lb (85.7 kg)  Height: 6\' 1"  (1.854 m)     Physical  Exam Vitals reviewed.  Constitutional:      Appearance: He is well-developed.  HENT:     Head: Normocephalic and atraumatic.  Neck:     Vascular: No carotid bruit or JVD.  Cardiovascular:     Rate and Rhythm: Normal rate and regular rhythm.     Heart sounds: Normal heart sounds. No murmur heard. Pulmonary:     Effort: Pulmonary effort is normal.     Breath sounds: Normal breath sounds. No wheezing or rales.  Abdominal:     Tenderness: There is no abdominal tenderness.  Musculoskeletal:     Right lower leg: No edema.     Left lower leg: No edema.  Skin:    General: Skin is warm and dry.  Neurological:     Mental Status: He is alert and oriented to person, place, and time.  Psychiatric:        Mood and Affect: Mood normal.       Assessment & Plan:  Brian Morrison is a 55 y.o. male . Benign prostatic  hyperplasia with nocturia  -Overall stable.  Still 1-2 episodes per night of nocturia.  Option of 0.8 mg dosing with potential side effects discussed.  New prescription given with dosage options.  Recheck labs in 3 months at physical.  Reactive airway disease, mild intermittent, uncomplicated Wheezing  -Well-controlled, updated albuterol prescription given if needed.  No recent use.  No orders of the defined types were placed in this encounter.  There are no Patient Instructions on file for this visit.    Signed,   Merri Ray, MD [facility] 02/01/21 2:44 PM

## 2021-05-23 ENCOUNTER — Ambulatory Visit (INDEPENDENT_AMBULATORY_CARE_PROVIDER_SITE_OTHER)
Admission: RE | Admit: 2021-05-23 | Discharge: 2021-05-23 | Disposition: A | Discharge status: Home / Self Care | Payer: BC Managed Care – PPO | Source: Ambulatory Visit | Attending: Family Medicine | Admitting: Family Medicine

## 2021-05-23 ENCOUNTER — Other Ambulatory Visit: Payer: Self-pay

## 2021-05-23 ENCOUNTER — Encounter: Payer: Self-pay | Admitting: Family Medicine

## 2021-05-23 ENCOUNTER — Ambulatory Visit (INDEPENDENT_AMBULATORY_CARE_PROVIDER_SITE_OTHER): Payer: BC Managed Care – PPO | Admitting: Family Medicine

## 2021-05-23 VITALS — BP 100/72 | HR 57 | Temp 98.0°F | Ht 74.0 in | Wt 188.0 lb

## 2021-05-23 DIAGNOSIS — Z131 Encounter for screening for diabetes mellitus: Secondary | ICD-10-CM | POA: Diagnosis not present

## 2021-05-23 DIAGNOSIS — M6289 Other specified disorders of muscle: Secondary | ICD-10-CM

## 2021-05-23 DIAGNOSIS — R0600 Dyspnea, unspecified: Secondary | ICD-10-CM

## 2021-05-23 DIAGNOSIS — Z13220 Encounter for screening for lipoid disorders: Secondary | ICD-10-CM

## 2021-05-23 DIAGNOSIS — J4520 Mild intermittent asthma, uncomplicated: Secondary | ICD-10-CM

## 2021-05-23 DIAGNOSIS — R079 Chest pain, unspecified: Secondary | ICD-10-CM

## 2021-05-23 DIAGNOSIS — R062 Wheezing: Secondary | ICD-10-CM | POA: Diagnosis not present

## 2021-05-23 DIAGNOSIS — Z0000 Encounter for general adult medical examination without abnormal findings: Secondary | ICD-10-CM

## 2021-05-23 DIAGNOSIS — Z1329 Encounter for screening for other suspected endocrine disorder: Secondary | ICD-10-CM | POA: Diagnosis not present

## 2021-05-23 MED ORDER — FLUTICASONE PROPIONATE HFA 44 MCG/ACT IN AERO: 2.0000 | INHALATION_SPRAY | Freq: Two times a day (BID) | RESPIRATORY_TRACT | 12 refills | Status: DC

## 2021-05-23 NOTE — Progress Notes (Signed)
? ?Subjective:  ?Patient ID: Brian Morrison, male    DOB: 05-11-1965  Age: 56 y.o. MRN: [id number] ? ?CC:  ?Chief Complaint  ?Patient presents with  ? Annual Exam  ? Wheezing  ?  X3 weeks, initially noted at night, worse lately with right sided chest pain, using an Albuterol inhaler with relief, questions if this could be due to testing positive for Covid on January 15th  ? ? ?HPI ?Brian Morrison presents for Annual Exam ? ?And other concerns as above ?Exam today physical exam today as well as ? ?Wheezing, chest pain, leg fatigue: ?History of asthma, treated with albuterol as needed.  Did have COVID-19 positive testing on January 15th, mild symtpoms. Some fatigue. Had improved, not quite to baseline but much better.  ?Albuterol needed more frequently past week- few times per day, nighttime or middle of night.  ?R sided chest soreness few nights ago. Intermittent. Occasional soreness with deep breath.  ?Min cough. No calf swelling/pain. Some leg fatigue past week - with running only.  ?No recent prolonged car travel or air travel - drove to [facility] 1 month ago, about a week later with dyspnea.no recent calf pain or swelling. ?No nasal congestion, sneezing.  ?Using flonase consistently - treating allergies well.  ? ? ? ? ?  05/23/2021  ?  1:56 PM 02/01/2021  ?  2:17 PM 04/19/2020  ?  8:53 AM 03/08/2020  ? 11:08 AM 09/17/2019  ? 11:43 AM  ?Depression screen PHQ 2/9  ?Decreased Interest 0 0 0 0 0  ?Down, Depressed, Hopeless 0 0 0 0 0  ?PHQ - 2 Score 0 0 0 0 0  ?Altered sleeping 2 1     ?Tired, decreased energy 2 1     ?Change in appetite 0 0     ?Feeling bad or failure about yourself  0 0     ?Trouble concentrating 0 0     ?Moving slowly or fidgety/restless 0 0     ?Suicidal thoughts 0 0     ?PHQ-9 Score 4 2     ? ? ?Health Maintenance  ?Topic Date Due  ? COVID-19 Vaccine (4 - Booster for [place] series) 06/08/2021 (Originally 03/29/2020)  ? Zoster Vaccines- Shingrix (1 of 2) 08/23/2021 (Originally 10/29/2017)  ? Hepatitis  C Screening  05/24/2022 (Originally 10/29/1985)  ? HIV Screening  05/24/2022 (Originally 10/30/1982)  ? COLONOSCOPY (Pts 45-32yr Insurance coverage will need to be confirmed)  02/09/2024  ? TETANUS/TDAP  09/16/2029  ? INFLUENZA VACCINE  Completed  ? HPV VACCINES  Aged Out  ?Colonoscopy 02/09/2019. Repeat 5 yrs.  ?Prostate: does not have family history of prostate cancer ?The natural history of prostate cancer and ongoing controversy regarding screening and potential treatment outcomes of prostate cancer has been discussed with the patient. The meaning of a false positive PSA and a false negative PSA has been discussed. He indicates understanding of the limitations of this screening test and wishes  to defer screening PSA testing until next year.  ?Lab Results  ?Component Value Date  ? PSA1 0.5 03/08/2020  ? PSA 0.46 06/28/2014  ? ? ?Immunization History  ?Administered Date(s) Administered  ? Influenza,inj,Quad PF,6+ Mos 01/15/2019, 01/04/2020, 12/26/2020  ? PFIZER(Purple Top)SARS-COV-2 Vaccination 05/15/2019, 06/05/2019, 02/02/2020  ? Pneumococcal Polysaccharide-23 01/15/2019  ? Tdap 09/17/2019  ? ? ?No results found. ?[place] Bowl- every other year.  ? ?Dental:Yes once per year/  ? ?Alcohol: none.  ? ?Tobacco: none ? ?Exercise: running QOD, home pushups, pull ups.  45 min runs usually.  ? ? ?History ?Patient Active Problem List  ? Diagnosis Date Noted  ? Ingrown toenail of right foot 09/17/2019  ? Morton's neuroma of right foot 09/17/2019  ? Asthma with exacerbation 07/18/2015  ? Allergic rhinitis 07/18/2015  ? Depression with anxiety 12/04/2011  ? ?Past Medical History:  ?Diagnosis Date  ? Allergic rhinitis   ? Allergy   ? Anxiety   ? Asthma   ? Depression   ? ?Past Surgical History:  ?Procedure Laterality Date  ? JOINT REPLACEMENT    ? R foot 3rd meditarsel  ? ?No Known Allergies ?Prior to Admission medications   ?Medication Sig Start Date End Date Taking? Authorizing Provider  ?albuterol (VENTOLIN HFA) 108 (90  Base) MCG/ACT inhaler Inhale 1-2 puffs into the lungs every 4 (four) hours as needed for wheezing or shortness of breath. 02/01/21  Yes Wendie Agreste, MD  ?fluticasone (FLONASE) 50 MCG/ACT nasal spray SHAKE LIQUID AND USE 2 SPRAYS IN EACH NOSTRIL EVERY DAY 02/02/20  Yes Wendie Agreste, MD  ?tamsulosin (FLOMAX) 0.4 MG CAPS capsule TAKE 1-2 CAPSULES(0.4 -0.'8MG'$ ) BY MOUTH DAILY 02/01/21  Yes Wendie Agreste, MD  ? ?Social History  ? ?Socioeconomic History  ? Marital status: Married  ?  Spouse name: Not on file  ? Number of children: 4  ? Years of education: College  ? Highest education level: Not on file  ?Occupational History  ? Not on file  ?Tobacco Use  ? Smoking status: Former  ?  Years: 10.00  ?  Types: Cigarettes  ?  Start date: 12/03/1997  ?  Quit date: 02/1998  ?  Years since quitting: 23.3  ? Smokeless tobacco: Never  ?Vaping Use  ? Vaping Use: Never used  ?Substance and Sexual Activity  ? Alcohol use: No  ?  Alcohol/week: 0.0 standard drinks  ? Drug use: No  ? Sexual activity: Yes  ?Other Topics Concern  ? Not on file  ?Social History Narrative  ? Drinks about 1 large cup of coffee a day   ? ?Social Determinants of Health  ? ?Financial Resource Strain: Not on file  ?Food Insecurity: Not on file  ?Transportation Needs: Not on file  ?Physical Activity: Not on file  ?Stress: Not on file  ?Social Connections: Not on file  ?Intimate Partner Violence: Not on file  ? ? ?Review of Systems ?13 point review of systems per patient health survey noted.  Negative other than as indicated above or in HPI.  ? ? ?Objective:  ? ?Vitals:  ? 05/23/21 1353  ?BP: 100/72  ?Pulse: (!) 57  ?Temp: 98 ?F (36.7 ?C)  ?TempSrc: Oral  ?SpO2: 98%  ?Weight: 188 lb (85.3 kg)  ?Height: '6\' 2"'$  (1.88 m)  ? ?  ? ?Physical Exam ?Vitals reviewed.  ?Constitutional:   ?   Appearance: He is well-developed.  ?HENT:  ?   Head: Normocephalic and atraumatic.  ?   Right Ear: External ear normal.  ?   Left Ear: External ear normal.  ?Eyes:  ?    Conjunctiva/sclera: Conjunctivae normal.  ?   Pupils: Pupils are equal, round, and reactive to light.  ?Neck:  ?   Thyroid: No thyromegaly.  ?Cardiovascular:  ?   Rate and Rhythm: Normal rate and regular rhythm.  ?   Heart sounds: Normal heart sounds.  ?Pulmonary:  ?   Effort: Pulmonary effort is normal. No respiratory distress.  ?   Breath sounds: Normal breath sounds. No stridor. No  wheezing, rhonchi or rales.  ?   Comments: Clear, no distress.  ?Abdominal:  ?   General: There is no distension.  ?   Palpations: Abdomen is soft.  ?   Tenderness: There is no abdominal tenderness.  ?Musculoskeletal:     ?   General: No tenderness. Normal range of motion.  ?   Cervical back: Normal range of motion and neck supple.  ?Lymphadenopathy:  ?   Cervical: No cervical adenopathy.  ?Skin: ?   General: Skin is warm and dry.  ?Neurological:  ?   Mental Status: He is alert and oriented to person, place, and time.  ?   Deep Tendon Reflexes: Reflexes are normal and symmetric.  ?Psychiatric:     ?   Behavior: Behavior normal.  ?Calves nontender, negative Homans.  No focal tenderness of the lower extremity musculature.  ? ?EKG, sinus rhythm, rate 53.  No acute ST/T wave changes or significant changes from 10/29/2017 EKG. ? ? ?Assessment & Plan:  ?Brian Morrison is a 56 y.o. male . ?Dyspnea, unspecified type - Plan: D-Dimer, Quantitative, DG Chest 2 View, CBC ?Chest pain, unspecified type - Plan: D-Dimer, Quantitative, EKG 12-Lead, DG Chest 2 View, CBC ?Reactive airway disease, mild intermittent, uncomplicated - Plan: fluticasone (FLOVENT HFA) 44 MCG/ACT inhaler ?Wheezing - Plan: fluticasone (FLOVENT HFA) 44 MCG/ACT inhaler, DG Chest 2 View ? -Prior intermittent/rare use of albuterol, recent increased need.  Possible flare of asthma versus irritant to upper airway versus postviral symptoms from COVID infection prior.  Chest x-ray ordered.  Add Flovent HFA, continue albuterol as needed ? -However with chest pain, travel prior to onset  of symptoms will check a D-dimer, then consider CT angiogram to rule out PE but less likely.  ER precautions given. ? ?Annual physical exam ? - -anticipatory guidance as below in AVS, screening labs above. He

## 2021-05-23 NOTE — Patient Instructions (Addendum)
[place] [facility] for xray ?Walk in 8:30-4:30 during weekdays, no appointment needed ?[facility]  ?[place], [place] [address] ? ?Try adding flovent inhaler 2 puffs 2 times per day. Ok to continue albuterol as needed for wheezing for now.  I am checking some other blood work including blood clot screening test and if that is elevated may need other imaging. ? ?I am also checking some other screening labs in addition to the muscle enzyme test for the leg fatigue.  Follow-up to review your labs and the symptoms further in the next few weeks. ?Return to the clinic or go to the nearest emergency room if any of your symptoms worsen or new symptoms occur. ? ?Preventive Care 83-28 Years Old, Male ?Preventive care refers to lifestyle choices and visits with your health care provider that can promote health and wellness. Preventive care visits are also called wellness exams. ?What can I expect for my preventive care visit? ?Counseling ?During your preventive care visit, your health care provider may ask about your: ?Medical history, including: ?Past medical problems. ?Family medical history. ?Current health, including: ?Emotional well-being. ?Home life and relationship well-being. ?Sexual activity. ?Lifestyle, including: ?Alcohol, nicotine or tobacco, and drug use. ?Access to firearms. ?Diet, exercise, and sleep habits. ?Safety issues such as seatbelt and bike helmet use. ?Sunscreen use. ?Work and work Statistician. ?Physical exam ?Your health care provider will check your: ?Height and weight. These may be used to calculate your BMI (body mass index). BMI is a measurement that tells if you are at a healthy weight. ?Waist circumference. This measures the distance around your waistline. This measurement also tells if you are at a healthy weight and may help predict your risk of certain diseases, such as type 2 diabetes and high blood pressure. ?Heart rate and blood pressure. ?Body temperature. ?Skin for abnormal spots. ?What  immunizations do I need? ?Vaccines are usually given at various ages, according to a schedule. Your health care provider will recommend vaccines for you based on your age, medical history, and lifestyle or other factors, such as travel or where you work. ?What tests do I need? ?Screening ?Your health care provider may recommend screening tests for certain conditions. This may include: ?Lipid and cholesterol levels. ?Diabetes screening. This is done by checking your blood sugar (glucose) after you have not eaten for a while (fasting). ?Hepatitis B test. ?Hepatitis C test. ?HIV (human immunodeficiency virus) test. ?STI (sexually transmitted infection) testing, if you are at risk. ?Lung cancer screening. ?Prostate cancer screening. ?Colorectal cancer screening. ?Talk with your health care provider about your test results, treatment options, and if necessary, the need for more tests. ?Follow these instructions at home: ?Eating and drinking ? ?Eat a diet that includes fresh fruits and vegetables, whole grains, lean protein, and low-fat dairy products. ?Take vitamin and mineral supplements as recommended by your health care provider. ?Do not drink alcohol if your health care provider tells you not to drink. ?If you drink alcohol: ?Limit how much you have to 0-2 drinks a day. ?Know how much alcohol is in your drink. In the [place] one drink equals one 12 oz bottle of beer (355 mL), one 5 oz glass of wine (148 mL), or one 1? oz glass of hard liquor (44 mL). ?Lifestyle ?Brush your teeth every morning and night with fluoride toothpaste. Floss one time each day. ?Exercise for at least 30 minutes 5 or more days each week. ?Do not use any products that contain nicotine or tobacco. These products include cigarettes,  chewing tobacco, and vaping devices, such as e-cigarettes. If you need help quitting, ask your health care provider. ?Do not use drugs. ?If you are sexually active, practice safe sex. Use a condom or other form of  protection to prevent STIs. ?Take aspirin only as told by your health care provider. Make sure that you understand how much to take and what form to take. Work with your health care provider to find out whether it is safe and beneficial for you to take aspirin daily. ?Find healthy ways to manage stress, such as: ?Meditation, yoga, or listening to music. ?Journaling. ?Talking to a trusted person. ?Spending time with friends and family. ?Minimize exposure to UV radiation to reduce your risk of skin cancer. ?Safety ?Always wear your seat belt while driving or riding in a vehicle. ?Do not drive: ?If you have been drinking alcohol. Do not ride with someone who has been drinking. ?When you are tired or distracted. ?While texting. ?If you have been using any mind-altering substances or drugs. ?Wear a helmet and other protective equipment during sports activities. ?If you have firearms in your house, make sure you follow all gun safety procedures. ?What's next? ?Go to your health care provider once a year for an annual wellness visit. ?Ask your health care provider how often you should have your eyes and teeth checked. ?Stay up to date on all vaccines. ?This information is not intended to replace advice given to you by your health care provider. Make sure you discuss any questions you have with your health care provider. ?Document Revised: 08/16/2020 Document Reviewed: 08/16/2020 ?Elsevier Patient Education ? 2022 [place]. ? ? ? ?

## 2021-05-24 ENCOUNTER — Telehealth: Payer: Self-pay

## 2021-05-24 LAB — LIPID PANEL
Cholesterol: 157 mg/dL (ref 0–200)
HDL: 53.6 mg/dL (ref >39.00)
LDL Cholesterol: 84 mg/dL (ref 0–99)
NonHDL: 103.47
Total CHOL/HDL Ratio: 3
Triglycerides: 95 mg/dL (ref 0.0–149.0)
VLDL: 19 mg/dL (ref 0.0–40.0)

## 2021-05-24 LAB — COMPREHENSIVE METABOLIC PANEL
ALT: 16 U/L (ref 0–53)
AST: 18 U/L (ref 0–37)
Albumin: 4.7 g/dL (ref 3.5–5.2)
Alkaline Phosphatase: 52 U/L (ref 39–117)
BUN: 19 mg/dL (ref 6–23)
CO2: 30 mEq/L (ref 19–32)
Calcium: 9.1 mg/dL (ref 8.4–10.5)
Chloride: 101 mEq/L (ref 96–112)
Creatinine, Ser: 1.16 mg/dL (ref 0.40–1.50)
GFR: 71.88 mL/min (ref >60.00)
Glucose, Bld: 88 mg/dL (ref 70–99)
Potassium: 4.6 mEq/L (ref 3.5–5.1)
Sodium: 138 mEq/L (ref 135–145)
Total Bilirubin: 1 mg/dL (ref 0.2–1.2)
Total Protein: 6.8 g/dL (ref 6.0–8.3)

## 2021-05-24 LAB — TSH: TSH: 1.71 u[IU]/mL (ref 0.35–5.50)

## 2021-05-24 LAB — HEMOGLOBIN A1C: Hgb A1c MFr Bld: 5.3 % (ref 4.6–6.5)

## 2021-05-24 LAB — CBC
HCT: 44.2 % (ref 39.0–52.0)
Hemoglobin: 15.4 g/dL (ref 13.0–17.0)
MCHC: 34.9 g/dL (ref 30.0–36.0)
MCV: 89.5 fl (ref 78.0–100.0)
Platelets: 208 10*3/uL (ref 150.0–400.0)
RBC: 4.94 Mil/uL (ref 4.22–5.81)
RDW: 13.1 % (ref 11.5–15.5)
WBC: 6.3 10*3/uL (ref 4.0–10.5)

## 2021-05-24 LAB — D-DIMER, QUANTITATIVE: D-Dimer, Quant: <0.19 mcg/mL FEU (ref <0.50)

## 2021-05-24 NOTE — Telephone Encounter (Signed)
Caller name:Bryton Zuowski  ? ?On DPR? :Yes ? ?Call back number:[phone] ? ?Provider they see: Carlota Raspberry ? ?Reason for call:Pt is calling pharmacy reached out to patient on fluticasone (FLOVENT HFA) 44 MCG/ACT inhaler  and a pro- authorization will need to be done to get this medication processed  ? ?

## 2021-05-25 ENCOUNTER — Other Ambulatory Visit: Payer: Self-pay

## 2021-05-25 DIAGNOSIS — R062 Wheezing: Secondary | ICD-10-CM

## 2021-05-25 DIAGNOSIS — J4520 Mild intermittent asthma, uncomplicated: Secondary | ICD-10-CM

## 2021-05-25 NOTE — Telephone Encounter (Signed)
Submitted request via Cover My Meds will check status  ?

## 2021-05-28 NOTE — Telephone Encounter (Signed)
Request was cancelled for unknown reason, will have to call [employer] Tuesday for [place]  ?

## 2021-05-29 NOTE — Telephone Encounter (Signed)
Called pt insurance and they informed me this was for generic fluticasone and the pt has picked this up after brand only was applied, no further actions required will close note.  ? ?

## 2021-06-06 ENCOUNTER — Encounter: Payer: Self-pay | Admitting: Family Medicine

## 2021-06-06 ENCOUNTER — Ambulatory Visit: Payer: BC Managed Care – PPO | Admitting: Family Medicine

## 2021-06-06 VITALS — BP 122/80 | HR 51 | Temp 98.2°F | Resp 17 | Ht 74.0 in | Wt 187.4 lb

## 2021-06-06 DIAGNOSIS — J4520 Mild intermittent asthma, uncomplicated: Secondary | ICD-10-CM | POA: Diagnosis not present

## 2021-06-06 DIAGNOSIS — R0600 Dyspnea, unspecified: Secondary | ICD-10-CM | POA: Diagnosis not present

## 2021-06-06 DIAGNOSIS — M6289 Other specified disorders of muscle: Secondary | ICD-10-CM | POA: Diagnosis not present

## 2021-06-06 NOTE — Patient Instructions (Addendum)
Continue flovent for next 2 months, then can try tapering off if doing well.  ?If return of shortness of breath, or tightness with exertion I would recommend meeting with cardiology - let mw know.  ?If any worsening tightness or not quickly resolving, be seen in ER.  ?Thanks for coming in today. ? ? ? ? ?

## 2021-06-06 NOTE — Progress Notes (Signed)
? ?Subjective:  ?Patient ID: Brian Morrison, male    DOB: January 20, 1966  Age: 56 y.o. MRN: [id number] ? ?CC:  ?Chief Complaint  ?Patient presents with  ? Wheezing  ?  Seems to have resolved with flovent use   ? Leg Pain  ?  Pt leg fatigue has resolved with no concerns   ? ? ?HPI ?Brian Morrison presents for  ? ?Dyspnea, fatigue ?Follow-up from March 22.  Possible asthma flare versus upper airway cough syndrome with wheeze.  Flovent added, continued albuterol.  D-dimer was normal, TSH, CBC, CPK normal with previous muscle fatigue. ?Feeling better since last visit, no further leg pain.  Wheezing has improved with use of Flovent. ?Not needing albuterol in past week.  ?Fatigue better. Min dyspnea with run last week, fleeting - resolved and able to run again. Slight tight feeling, no pain. Trouble getting deep breath, 5-56mn, then quick resolution.  ?Recent runs have gone well. No symptoms.  ?Paternal uncle and GF with heart disease. No heart disease in father.  ? ? ?History ?Patient Active Problem List  ? Diagnosis Date Noted  ? Ingrown toenail of right foot 09/17/2019  ? Morton's neuroma of right foot 09/17/2019  ? Asthma with exacerbation 07/18/2015  ? Allergic rhinitis 07/18/2015  ? Depression with anxiety 12/04/2011  ? ?Past Medical History:  ?Diagnosis Date  ? Allergic rhinitis   ? Allergy   ? Anxiety   ? Asthma   ? Depression   ? ?Past Surgical History:  ?Procedure Laterality Date  ? JOINT REPLACEMENT    ? R foot 3rd meditarsel  ? ?No Known Allergies ?Prior to Admission medications   ?Medication Sig Start Date End Date Taking? Authorizing Provider  ?albuterol (VENTOLIN HFA) 108 (90 Base) MCG/ACT inhaler Inhale 1-2 puffs into the lungs every 4 (four) hours as needed for wheezing or shortness of breath. 02/01/21  Yes GWendie Agreste MD  ?fluticasone (FLONASE) 50 MCG/ACT nasal spray SHAKE LIQUID AND USE 2 SPRAYS IN EACH NOSTRIL EVERY DAY 02/02/20  Yes GWendie Agreste MD  ?fluticasone (FLOVENT HFA) 44 MCG/ACT  inhaler Inhale 2 puffs into the lungs 2 (two) times daily. 05/23/21  Yes GWendie Agreste MD  ?tamsulosin (FLOMAX) 0.4 MG CAPS capsule TAKE 1-2 CAPSULES(0.4 -0.'8MG'$ ) BY MOUTH DAILY 02/01/21  Yes GWendie Agreste MD  ? ?Social History  ? ?Socioeconomic History  ? Marital status: Married  ?  Spouse name: Not on file  ? Number of children: 4  ? Years of education: College  ? Highest education level: Not on file  ?Occupational History  ? Not on file  ?Tobacco Use  ? Smoking status: Former  ?  Years: 10.00  ?  Types: Cigarettes  ?  Start date: 12/03/1997  ?  Quit date: 02/1998  ?  Years since quitting: 23.3  ? Smokeless tobacco: Never  ?Vaping Use  ? Vaping Use: Never used  ?Substance and Sexual Activity  ? Alcohol use: No  ?  Alcohol/week: 0.0 standard drinks  ? Drug use: No  ? Sexual activity: Yes  ?Other Topics Concern  ? Not on file  ?Social History Narrative  ? Drinks about 1 large cup of coffee a day   ? ?Social Determinants of Health  ? ?Financial Resource Strain: Not on file  ?Food Insecurity: Not on file  ?Transportation Needs: Not on file  ?Physical Activity: Not on file  ?Stress: Not on file  ?Social Connections: Not on file  ?Intimate Partner Violence: Not on file  ? ? ?  Review of Systems ? ?Per HPI.  ?Objective:  ? ?Vitals:  ? 06/06/21 1339  ?BP: 122/80  ?Pulse: (!) 51  ?Resp: 17  ?Temp: 98.2 ?F (36.8 ?C)  ?TempSrc: Temporal  ?SpO2: 97%  ?Weight: 187 lb 6.4 oz (85 kg)  ?Height: '6\' 2"'$  (1.88 m)  ? ? ? ?Physical Exam ?Vitals reviewed.  ?Constitutional:   ?   Appearance: He is well-developed.  ?HENT:  ?   Head: Normocephalic and atraumatic.  ?Neck:  ?   Vascular: No carotid bruit or JVD.  ?Cardiovascular:  ?   Rate and Rhythm: Normal rate and regular rhythm.  ?   Heart sounds: Normal heart sounds. No murmur heard. ?Pulmonary:  ?   Effort: Pulmonary effort is normal.  ?   Breath sounds: Normal breath sounds. No rales.  ?Musculoskeletal:  ?   Right lower leg: No edema.  ?   Left lower leg: No edema.  ?   Comments:  Legs nontender.   ?Skin: ?   General: Skin is warm and dry.  ?Neurological:  ?   Mental Status: He is alert and oriented to person, place, and time.  ?Psychiatric:     ?   Mood and Affect: Mood normal.  ? ? ? ? ? ?Assessment & Plan:  ?Ellwyn Ergle is a 56 y.o. male . ?Reactive airway disease, mild intermittent, uncomplicated ? ?Dyspnea, unspecified type ? ?Muscle fatigue ? ?Reassuring labs from last visit.  Symptoms have improved.  Possible component of reactive airway now proved with use of Flovent.  Continue for the next few months, then taper off his allergy season improves to just albuterol as needed if symptoms stable. ? ?Fleeting episode of dyspnea, tightness but no pain.  Discussed cardiology eval but deferred at this time.  If symptoms recur, then I would want him to be seen by cardiology to decide on possible stress testing.  ER precautions given if persistent or worsening symptoms ? ?No orders of the defined types were placed in this encounter. ? ?Patient Instructions  ?Continue flovent for next 2 months, then can try tapering off if doing well.  ?If return of shortness of breath, or tightness with exertion I would recommend meeting with cardiology - let mw know.  ?If any worsening tightness or not quickly resolving, be seen in ER.  ?Thanks for coming in today. ? ? ? ? ? ? ? ?Signed,  ? ?Merri Ray, MD ?[place], [facility] ?[facility] ?06/06/21 ?2:29 PM ? ? ?

## 2021-08-09 ENCOUNTER — Other Ambulatory Visit: Payer: Self-pay | Admitting: Family Medicine

## 2021-08-09 DIAGNOSIS — N401 Enlarged prostate with lower urinary tract symptoms: Secondary | ICD-10-CM

## 2021-10-16 DIAGNOSIS — H5213 Myopia, bilateral: Secondary | ICD-10-CM | POA: Diagnosis not present

## 2021-10-16 DIAGNOSIS — H524 Presbyopia: Secondary | ICD-10-CM | POA: Diagnosis not present

## 2022-01-03 ENCOUNTER — Ambulatory Visit (INDEPENDENT_AMBULATORY_CARE_PROVIDER_SITE_OTHER): Payer: BC Managed Care – PPO | Admitting: Family Medicine

## 2022-01-03 DIAGNOSIS — Z23 Encounter for immunization: Secondary | ICD-10-CM | POA: Diagnosis not present

## 2022-01-04 NOTE — Progress Notes (Signed)
Pt presented for influenza shot no concerns noted

## 2022-03-01 ENCOUNTER — Other Ambulatory Visit: Payer: Self-pay | Admitting: Family Medicine

## 2022-03-01 DIAGNOSIS — N401 Enlarged prostate with lower urinary tract symptoms: Secondary | ICD-10-CM

## 2022-03-05 NOTE — Telephone Encounter (Signed)
Patient is requesting a refill of the following medications: Requested Prescriptions   Pending Prescriptions Disp Refills   tamsulosin (FLOMAX) 0.4 MG CAPS capsule [Pharmacy Med Name: TAMSULOSIN 0.'4MG'$  CAPSULES] 180 capsule 1    Sig: TAKE 1 TO 2 CAPSULES(0.4 TO 0.8 MG) BY MOUTH DAILY    Date of patient request: 03/05/22 Last office visit: 12/03/21 Date of last refill: 08/10/21 Last refill amount: 180

## 2022-10-25 DIAGNOSIS — H02831 Dermatochalasis of right upper eyelid: Secondary | ICD-10-CM | POA: Diagnosis not present

## 2022-10-25 DIAGNOSIS — H02834 Dermatochalasis of left upper eyelid: Secondary | ICD-10-CM | POA: Diagnosis not present

## 2022-10-25 DIAGNOSIS — H5213 Myopia, bilateral: Secondary | ICD-10-CM | POA: Diagnosis not present

## 2022-12-20 ENCOUNTER — Encounter: Payer: Self-pay | Admitting: Family Medicine

## 2022-12-20 ENCOUNTER — Ambulatory Visit (INDEPENDENT_AMBULATORY_CARE_PROVIDER_SITE_OTHER): Payer: BC Managed Care – PPO | Admitting: Family Medicine

## 2022-12-20 VITALS — BP 100/70 | HR 54 | Temp 97.7°F | Ht 74.0 in | Wt 174.4 lb

## 2022-12-20 DIAGNOSIS — Z131 Encounter for screening for diabetes mellitus: Secondary | ICD-10-CM

## 2022-12-20 DIAGNOSIS — R351 Nocturia: Secondary | ICD-10-CM

## 2022-12-20 DIAGNOSIS — Z0000 Encounter for general adult medical examination without abnormal findings: Secondary | ICD-10-CM | POA: Diagnosis not present

## 2022-12-20 DIAGNOSIS — R634 Abnormal weight loss: Secondary | ICD-10-CM | POA: Diagnosis not present

## 2022-12-20 DIAGNOSIS — Z125 Encounter for screening for malignant neoplasm of prostate: Secondary | ICD-10-CM | POA: Diagnosis not present

## 2022-12-20 DIAGNOSIS — E876 Hypokalemia: Secondary | ICD-10-CM

## 2022-12-20 DIAGNOSIS — Z23 Encounter for immunization: Secondary | ICD-10-CM | POA: Diagnosis not present

## 2022-12-20 DIAGNOSIS — Z13220 Encounter for screening for lipoid disorders: Secondary | ICD-10-CM | POA: Diagnosis not present

## 2022-12-20 DIAGNOSIS — J301 Allergic rhinitis due to pollen: Secondary | ICD-10-CM

## 2022-12-20 DIAGNOSIS — N401 Enlarged prostate with lower urinary tract symptoms: Secondary | ICD-10-CM

## 2022-12-20 LAB — SEDIMENTATION RATE: Sed Rate: 1 mm/h (ref 0–20)

## 2022-12-20 LAB — COMPREHENSIVE METABOLIC PANEL
ALT: 28 U/L (ref 0–53)
AST: 21 U/L (ref 0–37)
Albumin: 4.5 g/dL (ref 3.5–5.2)
Alkaline Phosphatase: 45 U/L (ref 39–117)
BUN: 15 mg/dL (ref 6–23)
CO2: 30 meq/L (ref 19–32)
Calcium: 9.8 mg/dL (ref 8.4–10.5)
Chloride: 103 meq/L (ref 96–112)
Creatinine, Ser: 1.13 mg/dL (ref 0.40–1.50)
GFR: 73.36 mL/min (ref >60.00)
Glucose, Bld: 79 mg/dL (ref 70–99)
Potassium: 5.5 meq/L — ABNORMAL HIGH (ref 3.5–5.1)
Sodium: 141 meq/L (ref 135–145)
Total Bilirubin: 1.3 mg/dL — ABNORMAL HIGH (ref 0.2–1.2)
Total Protein: 6.7 g/dL (ref 6.0–8.3)

## 2022-12-20 LAB — CBC
HCT: 45.8 % (ref 39.0–52.0)
Hemoglobin: 15.1 g/dL (ref 13.0–17.0)
MCHC: 32.8 g/dL (ref 30.0–36.0)
MCV: 93 fL (ref 78.0–100.0)
Platelets: 228 10*3/uL (ref 150.0–400.0)
RBC: 4.93 Mil/uL (ref 4.22–5.81)
RDW: 12.8 % (ref 11.5–15.5)
WBC: 5.2 10*3/uL (ref 4.0–10.5)

## 2022-12-20 LAB — LIPID PANEL
Cholesterol: 170 mg/dL (ref 0–200)
HDL: 60 mg/dL (ref >39.00)
LDL Cholesterol: 90 mg/dL (ref 0–99)
NonHDL: 110.26
Total CHOL/HDL Ratio: 3
Triglycerides: 99 mg/dL (ref 0.0–149.0)
VLDL: 19.8 mg/dL (ref 0.0–40.0)

## 2022-12-20 LAB — PSA: PSA: 0.32 ng/mL (ref 0.10–4.00)

## 2022-12-20 LAB — LIPASE: Lipase: 45 U/L (ref 11.0–59.0)

## 2022-12-20 LAB — TSH: TSH: 1.18 u[IU]/mL (ref 0.35–5.50)

## 2022-12-20 MED ORDER — TAMSULOSIN HCL 0.4 MG PO CAPS
ORAL_CAPSULE | ORAL | 1 refills | Status: DC
Start: 2022-12-20 — End: 2023-07-04

## 2022-12-20 NOTE — Patient Instructions (Signed)
Thanks for coming in today.  No medication changes at this time.  I will check some screening labs for weight loss, but I would like to keep a close eye on that with recheck in 6 weeks.  If any concerns on labs I will let you know.  I suspect decreased appetite may have been the main contributor but let's make sure that remains stable.  Let me know if there are questions and take care!  Preventive Care 63-56 Years Old, Male Preventive care refers to lifestyle choices and visits with your health care provider that can promote health and wellness. Preventive care visits are also called wellness exams. What can I expect for my preventive care visit? Counseling During your preventive care visit, your health care provider may ask about your: Medical history, including: Past medical problems. Family medical history. Current health, including: Emotional well-being. Home life and relationship well-being. Sexual activity. Lifestyle, including: Alcohol, nicotine or tobacco, and drug use. Access to firearms. Diet, exercise, and sleep habits. Safety issues such as seatbelt and bike helmet use. Sunscreen use. Work and work Astronomer. Physical exam Your health care provider will check your: Height and weight. These may be used to calculate your BMI (body mass index). BMI is a measurement that tells if you are at a healthy weight. Waist circumference. This measures the distance around your waistline. This measurement also tells if you are at a healthy weight and may help predict your risk of certain diseases, such as type 2 diabetes and high blood pressure. Heart rate and blood pressure. Body temperature. Skin for abnormal spots. What immunizations do I need?  Vaccines are usually given at various ages, according to a schedule. Your health care provider will recommend vaccines for you based on your age, medical history, and lifestyle or other factors, such as travel or where you work. What tests do  I need? Screening Your health care provider may recommend screening tests for certain conditions. This may include: Lipid and cholesterol levels. Diabetes screening. This is done by checking your blood sugar (glucose) after you have not eaten for a while (fasting). Hepatitis B test. Hepatitis C test. HIV (human immunodeficiency virus) test. STI (sexually transmitted infection) testing, if you are at risk. Lung cancer screening. Prostate cancer screening. Colorectal cancer screening. Talk with your health care provider about your test results, treatment options, and if necessary, the need for more tests. Follow these instructions at home: Eating and drinking  Eat a diet that includes fresh fruits and vegetables, whole grains, lean protein, and low-fat dairy products. Take vitamin and mineral supplements as recommended by your health care provider. Do not drink alcohol if your health care provider tells you not to drink. If you drink alcohol: Limit how much you have to 0-2 drinks a day. Know how much alcohol is in your drink. In the [place] one drink equals one 12 oz bottle of beer (355 mL), one 5 oz glass of wine (148 mL), or one 1 oz glass of hard liquor (44 mL). Lifestyle Brush your teeth every morning and night with fluoride toothpaste. Floss one time each day. Exercise for at least 30 minutes 5 or more days each week. Do not use any products that contain nicotine or tobacco. These products include cigarettes, chewing tobacco, and vaping devices, such as e-cigarettes. If you need help quitting, ask your health care provider. Do not use drugs. If you are sexually active, practice safe sex. Use a condom or other form of protection to prevent  STIs. Take aspirin only as told by your health care provider. Make sure that you understand how much to take and what form to take. Work with your health care provider to find out whether it is safe and beneficial for you to take aspirin daily. Find  healthy ways to manage stress, such as: Meditation, yoga, or listening to music. Journaling. Talking to a trusted person. Spending time with friends and family. Minimize exposure to UV radiation to reduce your risk of skin cancer. Safety Always wear your seat belt while driving or riding in a vehicle. Do not drive: If you have been drinking alcohol. Do not ride with someone who has been drinking. When you are tired or distracted. While texting. If you have been using any mind-altering substances or drugs. Wear a helmet and other protective equipment during sports activities. If you have firearms in your house, make sure you follow all gun safety procedures. What's next? Go to your health care provider once a year for an annual wellness visit. Ask your health care provider how often you should have your eyes and teeth checked. Stay up to date on all vaccines. This information is not intended to replace advice given to you by your health care provider. Make sure you discuss any questions you have with your health care provider. Document Revised: 08/16/2020 Document Reviewed: 08/16/2020 Elsevier Patient Education  2024 [employer].

## 2022-12-20 NOTE — Progress Notes (Signed)
Subjective:  Patient ID: Brian Morrison, male    DOB: 1965/04/08  Age: 57 y.o. MRN: [id number]  CC:  Chief Complaint  Patient presents with   Annual Exam    Fasting.    HPI Lior Cwiklinski presents for Annual Exam PCP, me Ophthalmology, Dr. Nile Riggs Podiatry, Dr. 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  Allergic rhinitis with reactive airway disease Flonase for allergies, albuterol if needed for wheeze/reactive airway. No recent albuterol need. Flonase consistent use- working well.   BPH with nocturia, treated with Flomax. Working well.   Some temporary weigh loss for awhile earlier this year - less appetite at time, decreased intake. No n/v/abd pain. Plateau in weight loss. 15# total loss. Appetite minimally improved.  No night sweats or fever.  No change in stools.  Wt Readings from Last 3 Encounters:  12/20/22 174 lb 6.4 oz (79.1 kg)  06/06/21 187 lb 6.4 oz (85 kg)  05/23/21 188 lb (85.3 kg)        12/20/2022    8:03 AM 05/23/2021    1:56 PM 02/01/2021    2:17 PM 04/19/2020    8:53 AM 03/08/2020   11:08 AM  Depression screen PHQ 2/9  Decreased Interest 0 0 0 0 0  Down, Depressed, Hopeless 0 0 0 0 0  PHQ - 2 Score 0 0 0 0 0  Altered sleeping 1 2 1     Tired, decreased energy 1 2 1     Change in appetite 1 0 0    Feeling bad or failure about yourself  0 0 0    Trouble concentrating 0 0 0    Moving slowly or fidgety/restless 0 0 0    Suicidal thoughts 0 0 0    PHQ-9 Score 3 4 2     Difficult doing work/chores Not difficult at all        Health Maintenance  Topic Date Due   HIV Screening  Never done   Hepatitis C Screening  Never done   COVID-19 Vaccine (4 - 2023-24 season) 01/05/2023 (Originally 11/03/2022)   Zoster Vaccines- Shingrix (1 of 2) 03/22/2023 (Originally 10/29/2017)   Colonoscopy  02/09/2024   DTaP/Tdap/Td (2 - Td or Tdap) 09/16/2029   INFLUENZA VACCINE  Completed   HPV VACCINES   Aged Out  Colonoscopy 2020 - repeat 5 yrs. Sessile polyp. Dr. Adela Lank.  Prostate: does not  have family history of prostate cancer The natural history of prostate cancer and ongoing controversy regarding screening and potential treatment outcomes of prostate cancer has been discussed with the patient. The meaning of a false positive PSA and a false negative PSA has been discussed. He indicates understanding of the limitations of this screening test and wishes to proceed with screening PSA testing. Lab Results  Component Value Date   PSA1 0.5 03/08/2020   PSA 0.46 06/28/2014  No derm - no new skin lesions.   Immunization History  Administered Date(s) Administered   Influenza, Seasonal, Injecte, Preservative Fre 12/20/2022   Influenza,inj,Quad PF,6+ Mos 01/15/2019, 01/04/2020, 12/26/2020, 01/03/2022   PFIZER(Purple Top)SARS-COV-2 Vaccination 05/15/2019, 06/05/2019, 02/02/2020   Pneumococcal Polysaccharide-23 01/15/2019   Tdap 09/17/2019  Shingles vaccine - declines.  Covid booster - recommended.   No results found. Optho yearly.   Dental: yearly. No recent dental issues.   Alcohol: none  Tobacco: none  Exercise:running every other day - to hr.    History Patient Active Problem List   Diagnosis Date Noted  Ingrown toenail of right foot 09/17/2019   Morton's neuroma of right foot 09/17/2019   Asthma with exacerbation 07/18/2015   Allergic rhinitis 07/18/2015   Past Medical History:  Diagnosis Date   Allergic rhinitis    Allergy    Anxiety    Asthma    Depression    Past Surgical History:  Procedure Laterality Date   JOINT REPLACEMENT     R foot 3rd meditarsel   No Known Allergies Prior to Admission medications   Medication Sig Start Date End Date Taking? Authorizing Provider  albuterol (VENTOLIN HFA) 108 (90 Base) MCG/ACT inhaler Inhale 1-2 puffs into the lungs every 4 (four) hours as needed for wheezing or shortness of breath. 02/01/21  Yes Shade Flood, MD  fluticasone [facility]) 50 MCG/ACT nasal spray SHAKE LIQUID AND USE 2 SPRAYS IN EACH NOSTRIL EVERY DAY 02/02/20  Yes Shade Flood, MD  tamsulosin (FLOMAX) 0.4 MG CAPS capsule TAKE 1 TO 2 CAPSULES(0.4 TO 0.8 MG) BY MOUTH DAILY 03/05/22  Yes Shade Flood, MD   Social History   Socioeconomic History   Marital status: Married    Spouse name: Not on file   Number of children: 4   Years of education: College   Highest education level: Some college, no degree  Occupational History   Not on file  Tobacco Use   Smoking status: Former    Current packs/day: 0.00    Types: Cigarettes    Start date: 12/03/1997    Quit date: 02/1998    Years since quitting: 24.8   Smokeless tobacco: Never  Vaping Use   Vaping status: Never Used  Substance and Sexual Activity   Alcohol use: No    Alcohol/week: 0.0 standard drinks of alcohol   Drug use: No   Sexual activity: Yes  Other Topics Concern   Not on file  Social History Narrative   Drinks about 1 large cup of coffee a day    Social Determinants of Health   Financial Resource Strain: Low Risk  (12/19/2022)   Overall Financial Resource Strain (CARDIA)    Difficulty of Paying Living Expenses: Not hard at all  Food Insecurity: No Food Insecurity (12/19/2022)   Hunger Vital Sign    Worried About Running Out of Food in the Last Year: Never true    Ran Out of Food in the Last Year: Never true  Transportation Needs: No Transportation Needs (12/19/2022)   PRAPARE - Administrator, Civil Service (Medical): No    Lack of Transportation (Non-Medical): No  Physical Activity: Sufficiently Active (12/19/2022)   Exercise Vital Sign    Days of Exercise per Week: 4 days    Minutes of Exercise per Session: 40 min  Stress: Stress Concern Present (12/19/2022)   [employer] of Occupational Health - Occupational Stress Questionnaire    Feeling of Stress : To some extent  Social Connections: Moderately Integrated  (12/19/2022)   Social Connection and Isolation Panel [NHANES]    Frequency of Communication with Friends and Family: More than three times a week    Frequency of Social Gatherings with Friends and Family: Once a week    Attends Religious Services: More than 4 times per year    Active Member of [employer] or Organizations: No    Attends Engineer, structural: Not on file    Marital Status: Married  Catering manager Violence: Not on file    Review of Systems 13 point review of systems per patient  health survey noted.  Negative other than as indicated above or in HPI.    Objective:   Vitals:   12/20/22 0757  BP: 100/70  Pulse: (!) 54  Temp: 97.7 F (36.5 C)  SpO2: 96%  Weight: 174 lb 6.4 oz (79.1 kg)  Height: 6\' 2"  (1.88 m)     Physical Exam Vitals reviewed.  Constitutional:      Appearance: He is well-developed.  HENT:     Head: Normocephalic and atraumatic.     Right Ear: External ear normal.     Left Ear: External ear normal.  Eyes:     Conjunctiva/sclera: Conjunctivae normal.     Pupils: Pupils are equal, round, and reactive to light.  Neck:     Thyroid: No thyromegaly.  Cardiovascular:     Rate and Rhythm: Normal rate and regular rhythm.     Heart sounds: Normal heart sounds.  Pulmonary:     Effort: Pulmonary effort is normal. No respiratory distress.     Breath sounds: Normal breath sounds. No wheezing.  Abdominal:     General: There is no distension.     Palpations: Abdomen is soft.     Tenderness: There is no abdominal tenderness.  Musculoskeletal:        General: No tenderness. Normal range of motion.     Cervical back: Normal range of motion and neck supple.  Lymphadenopathy:     Cervical: No cervical adenopathy.  Skin:    General: Skin is warm and dry.  Neurological:     Mental Status: He is alert and oriented to person, place, and time.     Deep Tendon Reflexes: Reflexes are normal and symmetric.  Psychiatric:        Behavior: Behavior  normal.        Assessment & Plan:  Chauncy Mickley is a 57 y.o. male . Annual physical exam  - -anticipatory guidance as below in AVS, screening labs above. Health maintenance items as above in HPI discussed/recommended as applicable.   Need for influenza vaccination - Plan: Flu vaccine trivalent PF, 6mos and older(Flulaval,Afluria,Fluarix,Fluzone)  Screening for hyperlipidemia - Plan: Lipid panel  Screening for diabetes mellitus - Plan: Comprehensive metabolic panel  Allergic rhinitis due to pollen, unspecified seasonality  -Continue Flonase, albuterol as needed for reactive airway symptoms, stable recently.  BPH associated with nocturia  -Stable with Flomax, continue same.  Check PSA with weight loss and for prostate cancer screening.  Weight loss - Plan: Comprehensive metabolic panel, CBC, TSH, Lipase, Sedimentation rate  -Possibly due to decreased appetite, reports plateau of weight loss.  No concerning findings on exam.  Up-to-date on colonoscopy and denies stool changes.  Check screening labs including PSA, TSH, lipase, CBC, CMP and sed rate.  6-week follow-up.  No orders of the defined types were placed in this encounter.  Patient Instructions  Thanks for coming in today.  No medication changes at this time.  I will check some screening labs for weight loss, but I would like to keep a close eye on that with recheck in 6 weeks.  If any concerns on labs I will let you know.  I suspect decreased appetite may have been the main contributor but let's make sure that remains stable.  Let me know if there are questions and take care!  Preventive Care 50-15 Years Old, Male Preventive care refers to lifestyle choices and visits with your health care provider that can promote health and wellness. Preventive care visits are also called  wellness exams. What can I expect for my preventive care visit? Counseling During your preventive care visit, your health care provider may ask about  your: Medical history, including: Past medical problems. Family medical history. Current health, including: Emotional well-being. Home life and relationship well-being. Sexual activity. Lifestyle, including: Alcohol, nicotine or tobacco, and drug use. Access to firearms. Diet, exercise, and sleep habits. Safety issues such as seatbelt and bike helmet use. Sunscreen use. Work and work Astronomer. Physical exam Your health care provider will check your: Height and weight. These may be used to calculate your BMI (body mass index). BMI is a measurement that tells if you are at a healthy weight. Waist circumference. This measures the distance around your waistline. This measurement also tells if you are at a healthy weight and may help predict your risk of certain diseases, such as type 2 diabetes and high blood pressure. Heart rate and blood pressure. Body temperature. Skin for abnormal spots. What immunizations do I need?  Vaccines are usually given at various ages, according to a schedule. Your health care provider will recommend vaccines for you based on your age, medical history, and lifestyle or other factors, such as travel or where you work. What tests do I need? Screening Your health care provider may recommend screening tests for certain conditions. This may include: Lipid and cholesterol levels. Diabetes screening. This is done by checking your blood sugar (glucose) after you have not eaten for a while (fasting). Hepatitis B test. Hepatitis C test. HIV (human immunodeficiency virus) test. STI (sexually transmitted infection) testing, if you are at risk. Lung cancer screening. Prostate cancer screening. Colorectal cancer screening. Talk with your health care provider about your test results, treatment options, and if necessary, the need for more tests. Follow these instructions at home: Eating and drinking  Eat a diet that includes fresh fruits and vegetables, whole  grains, lean protein, and low-fat dairy products. Take vitamin and mineral supplements as recommended by your health care provider. Do not drink alcohol if your health care provider tells you not to drink. If you drink alcohol: Limit how much you have to 0-2 drinks a day. Know how much alcohol is in your drink. In the [place] one drink equals one 12 oz bottle of beer (355 mL), one 5 oz glass of wine (148 mL), or one 1 oz glass of hard liquor (44 mL). Lifestyle Brush your teeth every morning and night with fluoride toothpaste. Floss one time each day. Exercise for at least 30 minutes 5 or more days each week. Do not use any products that contain nicotine or tobacco. These products include cigarettes, chewing tobacco, and vaping devices, such as e-cigarettes. If you need help quitting, ask your health care provider. Do not use drugs. If you are sexually active, practice safe sex. Use a condom or other form of protection to prevent STIs. Take aspirin only as told by your health care provider. Make sure that you understand how much to take and what form to take. Work with your health care provider to find out whether it is safe and beneficial for you to take aspirin daily. Find healthy ways to manage stress, such as: Meditation, yoga, or listening to music. Journaling. Talking to a trusted person. Spending time with friends and family. Minimize exposure to UV radiation to reduce your risk of skin cancer. Safety Always wear your seat belt while driving or riding in a vehicle. Do not drive: If you have been drinking alcohol. Do not ride  with someone who has been drinking. When you are tired or distracted. While texting. If you have been using any mind-altering substances or drugs. Wear a helmet and other protective equipment during sports activities. If you have firearms in your house, make sure you follow all gun safety procedures. What's next? Go to your health care provider once a year for  an annual wellness visit. Ask your health care provider how often you should have your eyes and teeth checked. Stay up to date on all vaccines. This information is not intended to replace advice given to you by your health care provider. Make sure you discuss any questions you have with your health care provider. Document Revised: 08/16/2020 Document Reviewed: 08/16/2020 Elsevier Patient Education  2024 Elsevier Inc.     Signed,   Meredith Staggers, MD [facility], [facility] 12/20/22 8:30 AM

## 2022-12-27 NOTE — Addendum Note (Signed)
Addended by: Eldred Manges on: 12/27/2022 02:51 PM   Modules accepted: Orders

## 2023-01-02 ENCOUNTER — Other Ambulatory Visit: Payer: Self-pay

## 2023-01-02 DIAGNOSIS — E875 Hyperkalemia: Secondary | ICD-10-CM

## 2023-01-06 ENCOUNTER — Other Ambulatory Visit (INDEPENDENT_AMBULATORY_CARE_PROVIDER_SITE_OTHER): Payer: BC Managed Care – PPO

## 2023-01-06 DIAGNOSIS — E876 Hypokalemia: Secondary | ICD-10-CM | POA: Diagnosis not present

## 2023-01-06 LAB — BASIC METABOLIC PANEL
BUN: 16 mg/dL (ref 6–23)
CO2: 28 meq/L (ref 19–32)
Calcium: 9.5 mg/dL (ref 8.4–10.5)
Chloride: 102 meq/L (ref 96–112)
Creatinine, Ser: 1.16 mg/dL (ref 0.40–1.50)
GFR: 71.07 mL/min (ref >60.00)
Glucose, Bld: 92 mg/dL (ref 70–99)
Potassium: 4.9 meq/L (ref 3.5–5.1)
Sodium: 139 meq/L (ref 135–145)

## 2023-02-03 ENCOUNTER — Ambulatory Visit: Payer: BC Managed Care – PPO | Admitting: Family Medicine

## 2023-02-03 ENCOUNTER — Encounter: Payer: Self-pay | Admitting: Family Medicine

## 2023-02-03 VITALS — BP 128/78 | HR 62 | Temp 98.1°F | Ht 74.0 in | Wt 176.6 lb

## 2023-02-03 DIAGNOSIS — Z8639 Personal history of other endocrine, nutritional and metabolic disease: Secondary | ICD-10-CM

## 2023-02-03 DIAGNOSIS — R17 Unspecified jaundice: Secondary | ICD-10-CM

## 2023-02-03 DIAGNOSIS — Z87898 Personal history of other specified conditions: Secondary | ICD-10-CM | POA: Diagnosis not present

## 2023-02-03 LAB — COMPREHENSIVE METABOLIC PANEL
ALT: 32 U/L (ref 0–53)
AST: 24 U/L (ref 0–37)
Albumin: 4.4 g/dL (ref 3.5–5.2)
Alkaline Phosphatase: 49 U/L (ref 39–117)
BUN: 17 mg/dL (ref 6–23)
CO2: 29 meq/L (ref 19–32)
Calcium: 9 mg/dL (ref 8.4–10.5)
Chloride: 103 meq/L (ref 96–112)
Creatinine, Ser: 1.03 mg/dL (ref 0.40–1.50)
GFR: 81.92 mL/min (ref >60.00)
Glucose, Bld: 97 mg/dL (ref 70–99)
Potassium: 5 meq/L (ref 3.5–5.1)
Sodium: 138 meq/L (ref 135–145)
Total Bilirubin: 0.9 mg/dL (ref 0.2–1.2)
Total Protein: 6.8 g/dL (ref 6.0–8.3)

## 2023-02-03 NOTE — Progress Notes (Signed)
Subjective:  Patient ID: Brian Morrison, male    DOB: [birth year]  Age: 57 y.o. MRN: [id number]  CC: No chief complaint on file.   HPI Brian Morrison presents for   Weight loss Discussed that his physical October 18.  Had noticed some temporary weight loss earlier in the year, but less appetite at that time and less intake.  Did have a plateau in his weight loss of 15 pounds total loss, no nausea vomiting or abdominal pain and his appetite has slightly improved.  No night sweats fever or change in stools.  Colonoscopy in December 2020 with repeat in 5 years.  Overall labs reassuring last visit including CMP with only borderline bilirubin at 1.3, hyperkalemia at 5.5, normalized on repeat testing.  Normal CBC, lipase, lipid panel, sed rate and TSH as well as PSA.  Weight has increased by 2 pounds since that visit. No regular monitoring of weight - few times on scale - about same. No weight loss.  No abd pain, n/v, bloating, scleral icterus or jaundice.  No fever/night sweats  Appetite has been ok - early satiety compared to year to year and a half ago. Not full feeling, just not feeling like eating.   Wt Readings from Last 3 Encounters:  02/03/23 176 lb 9.6 oz (80.1 kg)  12/20/22 174 lb 6.4 oz (79.1 kg)  06/06/21 187 lb 6.4 oz (85 kg)  '  History Patient Active Problem List   Diagnosis Date Noted   Ingrown toenail of right foot 09/17/2019   Morton's neuroma of right foot 09/17/2019   Asthma with exacerbation 07/18/2015   Allergic rhinitis 07/18/2015   Past Medical History:  Diagnosis Date   Allergic rhinitis    Allergy    Anxiety    Asthma    Depression    Past Surgical History:  Procedure Laterality Date   JOINT REPLACEMENT     R foot 3rd meditarsel   No Known Allergies Prior to Admission medications   Medication Sig Start Date End Date Taking? Authorizing Provider  albuterol (VENTOLIN HFA) 108 (90 Base) MCG/ACT inhaler Inhale 1-2 puffs into the lungs every 4 (four)  hours as needed for wheezing or shortness of breath. 02/01/21   Shade Flood, MD  fluticasone [facility]) 50 MCG/ACT nasal spray SHAKE LIQUID AND USE 2 SPRAYS IN [facility] NOSTRIL EVERY DAY 02/02/20   Shade Flood, MD  tamsulosin (FLOMAX) 0.4 MG CAPS capsule TAKE 1 TO 2 CAPSULES(0.4 TO 0.8 MG) BY MOUTH DAILY 12/20/22   Shade Flood, MD   Social History   Socioeconomic History   Marital status: Married    Spouse name: Not on file   Number of children: 4   Years of education: College   Highest education level: Some college, no degree  Occupational History   Not on file  Tobacco Use   Smoking status: Former    Current packs/day: 0.00    Types: Cigarettes    Start date: 12/03/1997    Quit date: 02/1998    Years since quitting: 25.0   Smokeless tobacco: Never  Vaping Use   Vaping status: Never Used  Substance and Sexual Activity   Alcohol use: No    Alcohol/week: 0.0 standard drinks of alcohol   Drug use: No   Sexual activity: Yes  Other Topics Concern   Not on file  Social History Narrative   Drinks about 1 large cup of coffee a day    Social Determinants of Health   Financial  Resource Strain: Low Risk  (12/19/2022)   Overall Financial Resource Strain (CARDIA)    Difficulty of Paying Living Expenses: Not hard at all  Food Insecurity: No Food Insecurity (12/19/2022)   Hunger Vital Sign    Worried About Running Out of Food in the Last Year: Never true    Ran Out of Food in the Last Year: Never true  Transportation Needs: No Transportation Needs (12/19/2022)   PRAPARE - Administrator, Civil Service (Medical): No    Lack of Transportation (Non-Medical): No  Physical Activity: Sufficiently Active (12/19/2022)   Exercise Vital Sign    Days of Exercise per Week: 4 days    Minutes of Exercise per Session: 40 min  Stress: Stress Concern Present (12/19/2022)   [employer] of Occupational Health - Occupational Stress Questionnaire    Feeling of Stress : To  some extent  Social Connections: Moderately Integrated (12/19/2022)   Social Connection and Isolation Panel [NHANES]    Frequency of Communication with Friends and Family: More than three times a week    Frequency of Social Gatherings with Friends and Family: Once a week    Attends Religious Services: More than 4 times per year    Active Member of [employer] or Organizations: No    Attends Engineer, structural: Not on file    Marital Status: Married  Catering manager Violence: Not on file    Review of Systems   Objective:   Vitals:   02/03/23 1044  BP: 128/78  Pulse: 62  Temp: 98.1 F (36.7 C)  TempSrc: Temporal  SpO2: 98%  Weight: 176 lb 9.6 oz (80.1 kg)  Height: 6\' 2"  (1.88 m)     Physical Exam Vitals reviewed.  Constitutional:      Appearance: He is well-developed.  HENT:     Head: Normocephalic and atraumatic.  Eyes:     General: No scleral icterus. Neck:     Vascular: No carotid bruit or JVD.  Cardiovascular:     Rate and Rhythm: Normal rate and regular rhythm.     Heart sounds: Normal heart sounds. No murmur heard. Pulmonary:     Effort: Pulmonary effort is normal.     Breath sounds: Normal breath sounds. No rales.  Abdominal:     General: There is no distension.     Palpations: There is no mass.     Tenderness: There is no abdominal tenderness. There is no guarding or rebound.  Musculoskeletal:     Right lower leg: No edema.     Left lower leg: No edema.  Skin:    General: Skin is warm and dry.     Coloration: Skin is not jaundiced.  Neurological:     Mental Status: He is alert and oriented to person, place, and time.  Psychiatric:        Mood and Affect: Mood normal.     Assessment & Plan:  [name] is a 57 y.o. male . Elevated bilirubin - Plan: Comp Met (CMET)  History of hypokalemia - Plan: Comp Met (CMET)  H/O abnormal weight loss - Plan: Comp Met (CMET)  -Weight stable.  Denies other concerning symptoms.  Some early  satiety but denies abdominal discomfort, just not hungry.  Option of CT imaging, gastroenterology eval to decide on earlier colonoscopy or other testing.  He would like to defer that referral, testing at this time and continue to monitor with option for further workup if weight does not continue to improve  or any decline in weight.  Prior hyperkalemia normalized on repeat testing, will recheck today as well as bilirubin which was borderline previously.  RTC precautions, 18-month follow-up if stable.  No orders of the defined types were placed in this encounter.  Patient Instructions  I would recommend meeting with gastroenterology and possible CT scan for the prior weight loss. Glad to hear that it is stable at this time. Let me know if I can send that referral or order that test. If any further weight loss or new symptoms should have CT/referral sooner than later. Keep me posted.   If any concerns on labs today I will let you know but the bilirubin level was only borderline elevated previously.  If that is continuing to increase then we may want to check some other testing as discussed but nothing new for now.  Let me know if there are questions and take care.      Signed,   Meredith Staggers, MD [facility], [facility] 02/03/23 11:17 AM

## 2023-02-03 NOTE — Patient Instructions (Addendum)
I would recommend meeting with gastroenterology and possible CT scan for the prior weight loss. Glad to hear that it is stable at this time. Let me know if I can send that referral or order that test. If any further weight loss or new symptoms should have CT/referral sooner than later. Keep me posted.   If any concerns on labs today I will let you know but the bilirubin level was only borderline elevated previously.  If that is continuing to increase then we may want to check some other testing as discussed but nothing new for now.  Let me know if there are questions and take care.

## 2023-06-04 ENCOUNTER — Telehealth: Payer: Self-pay

## 2023-06-04 NOTE — Telephone Encounter (Signed)
 Copied from CRM [id number]. Topic: Clinical - Lab/Test Results >> Jun 04, 2023 11:10 AM Tiffany S wrote: Reason for CRM: Patient called asking if he could get his hormone levels checked when he comes in for his lab appt on 07/04/23

## 2023-06-04 NOTE — Telephone Encounter (Signed)
 Is this okay to go over at appointment with Dr.Greene on 5/2?

## 2023-06-04 NOTE — Telephone Encounter (Signed)
 More info please.  Is he having any specific symptoms and specific hormone testing he is requesting?  Thanks

## 2023-06-05 NOTE — Telephone Encounter (Signed)
 Noted.  I would like to discuss this further in office to then determine appropriate testing including possible hormone testing.  I do see has an appointment with me in May but okay to schedule an acute visit in the next week or so so we can discuss this further and check testing at that time.  Thanks

## 2023-06-05 NOTE — Telephone Encounter (Signed)
 Called patient to relay Dr.Greene's note, Left VM to return call

## 2023-06-05 NOTE — Telephone Encounter (Signed)
 Called patient to clarify the need for hormone testing. Patient stated he has noticed changes lately the might pertain to hormone levels such as body temperature changing constantly from hot to cold almost like flashes and he seems to be more emotional then usual.

## 2023-06-06 ENCOUNTER — Encounter: Payer: Self-pay | Admitting: Family Medicine

## 2023-06-06 ENCOUNTER — Ambulatory Visit: Admitting: Family Medicine

## 2023-06-06 VITALS — BP 98/60 | HR 62 | Temp 98.6°F | Ht 74.0 in | Wt 175.0 lb

## 2023-06-06 DIAGNOSIS — L749 Eccrine sweat disorder, unspecified: Secondary | ICD-10-CM | POA: Diagnosis not present

## 2023-06-06 DIAGNOSIS — R454 Irritability and anger: Secondary | ICD-10-CM | POA: Diagnosis not present

## 2023-06-06 DIAGNOSIS — Z8659 Personal history of other mental and behavioral disorders: Secondary | ICD-10-CM | POA: Diagnosis not present

## 2023-06-06 DIAGNOSIS — R7989 Other specified abnormal findings of blood chemistry: Secondary | ICD-10-CM

## 2023-06-06 DIAGNOSIS — R6889 Other general symptoms and signs: Secondary | ICD-10-CM

## 2023-06-06 DIAGNOSIS — E876 Hypokalemia: Secondary | ICD-10-CM

## 2023-06-06 LAB — CBC
HCT: 45.4 % (ref 39.0–52.0)
Hemoglobin: 15.5 g/dL (ref 13.0–17.0)
MCHC: 34.2 g/dL (ref 30.0–36.0)
MCV: 91.1 fl (ref 78.0–100.0)
Platelets: 227 10*3/uL (ref 150.0–400.0)
RBC: 4.98 Mil/uL (ref 4.22–5.81)
RDW: 12.5 % (ref 11.5–15.5)
WBC: 9.3 10*3/uL (ref 4.0–10.5)

## 2023-06-06 LAB — BASIC METABOLIC PANEL WITH GFR
BUN: 18 mg/dL (ref 6–23)
CO2: 27 meq/L (ref 19–32)
Calcium: 9.5 mg/dL (ref 8.4–10.5)
Chloride: 102 meq/L (ref 96–112)
Creatinine, Ser: 1.2 mg/dL (ref 0.40–1.50)
GFR: 68.04 mL/min (ref >60.00)
Glucose, Bld: 90 mg/dL (ref 70–99)
Potassium: 5.3 meq/L — ABNORMAL HIGH (ref 3.5–5.1)
Sodium: 140 meq/L (ref 135–145)

## 2023-06-06 LAB — TSH: TSH: 1.14 u[IU]/mL (ref 0.35–5.50)

## 2023-06-06 LAB — TESTOSTERONE: Testosterone: 280.45 ng/dL — ABNORMAL LOW (ref 300.00–890.00)

## 2023-06-06 NOTE — Patient Instructions (Addendum)
 Thanks for coming in today.  If any concerns on your labs I will let you know.  Restarting running should continue to help with stress management, but as we discussed sometimes depression symptoms will return and I would consider a low-dose of an SSRI like Paxil which you have taken previously or Zoloft.  Let me know and I am happy to send in a low-dose of that if you are not continuing to improve the next few weeks.  Can check and see how things are going after my second visit as well.  Take care!

## 2023-06-06 NOTE — Telephone Encounter (Signed)
 Called in attempt to schedule an appointment but there was no answer, LM to call back

## 2023-06-06 NOTE — Progress Notes (Signed)
 Subjective:  Patient ID: Brian Morrison, male    DOB: 1965-07-08  Age: 58 y.o. MRN: [id number]  CC:  Chief Complaint  Patient presents with   request for lab work    Pt notes some more emotional, body temperature, some other changes he is concerned are hormone related    Health Maintenance    Patient has declined HIV and Hep C screening as well as shingles and pneumonia vaccines, did not get a COVID-19 vaccine in 2024     HPI [name] presents for   Emotional/body temperature changes: Past 3-4 months.  More moody at times, irritable at times, more emotional, tears up. More worry about others at times. Episodes of breaking out in sweats at times. Initially few times per week. Had been running every other day - had been effective in past for stress management, stopped running in October with walking with wife. She had other issues, so had to stop walking. He also stopped walking in November. Off exercise until restarted running in February. Less lability in mood since restarting.  Less sweats recently but still feeling cold natured past 6 months.  No hair/skin changes.  Weight stable.  Sister had thyroid removed recently - unknown details, but not cancerous. She is now on meds post thyroidectomy.  Years ago had depression - able to work through sx's then with duloxetine, and therapy. Duloxetine in 2018. No recent meds or therapy and recent symptoms not as bad as at that time. Has taken in Paxil years ago. Some fatigue with duloxetine. Felt better of meds.  Mother with some health issues earlier this year - improving now.  No alcohol, CBD, or IDU.  Early wakening at times, not affecting functioning.  Wt Readings from Last 3 Encounters:  06/06/23 175 lb (79.4 kg)  02/03/23 176 lb 9.6 oz (80.1 kg)  12/20/22 174 lb 6.4 oz (79.1 kg)  Some initial muscle weakness with restarting running, improving.    Lab Results  Component Value Date   TSH 1.18 12/20/2022      06/06/2023    12:45 PM 06/06/2023   12:44 PM 02/03/2023   10:44 AM 12/20/2022    8:03 AM 05/23/2021    1:56 PM  Depression screen PHQ 2/9  Decreased Interest 0 0 0 0 0  Down, Depressed, Hopeless 0 0 0 0 0  PHQ - 2 Score 0 0 0 0 0  Altered sleeping 1  1 1 2   Tired, decreased energy 1  0 1 2  Change in appetite 0  0 1 0  Feeling bad or failure about yourself  0  0 0 0  Trouble concentrating 0  0 0 0  Moving slowly or fidgety/restless 0  0 0 0  Suicidal thoughts 0  0 0 0  PHQ-9 Score 2  1 3 4   Difficult doing work/chores Not difficult at all   Not difficult at all       06/06/2023   12:45 PM 02/03/2023   10:44 AM 12/20/2022    8:03 AM  GAD 7 : Generalized Anxiety Score  Nervous, Anxious, on Edge 1 1 1   Control/stop worrying 0 0 0  Worry too much - different things 0 0 0  Trouble relaxing 0 1 0  Restless 0 0 0  Easily annoyed or irritable 0 0 0  Afraid - awful might happen 1 0 1  Total GAD 7 Score 2 2 2   Anxiety Difficulty Not difficult at all  Not difficult at all  History Patient Active Problem List   Diagnosis Date Noted   Ingrown toenail of right foot 09/17/2019   Morton's neuroma of right foot 09/17/2019   Asthma with exacerbation 07/18/2015   Allergic rhinitis 07/18/2015   Past Medical History:  Diagnosis Date   Allergic rhinitis    Allergy    Anxiety    Asthma    Depression    Past Surgical History:  Procedure Laterality Date   JOINT REPLACEMENT     R foot 3rd meditarsel   No Known Allergies Prior to Admission medications   Medication Sig Start Date End Date Taking? Authorizing Provider  fluticasone (FLONASE) 50 MCG/ACT nasal spray SHAKE LIQUID AND USE 2 SPRAYS IN EACH NOSTRIL EVERY DAY 02/02/20  Yes Shade Flood, MD  tamsulosin (FLOMAX) 0.4 MG CAPS capsule TAKE 1 TO 2 CAPSULES(0.4 TO 0.8 MG) BY MOUTH DAILY 12/20/22  Yes Shade Flood, MD  albuterol (VENTOLIN HFA) 108 (90 Base) MCG/ACT inhaler Inhale 1-2 puffs into the lungs every 4 (four) hours as needed  for wheezing or shortness of breath. Patient not taking: Reported on 06/06/2023 02/01/21   Shade Flood, MD   Social History   Socioeconomic History   Marital status: Married    Spouse name: Not on file   Number of children: 4   Years of education: College   Highest education level: Some college, no degree  Occupational History   Not on file  Tobacco Use   Smoking status: Former    Current packs/day: 0.00    Types: Cigarettes    Start date: 12/03/1997    Quit date: 02/1998    Years since quitting: 25.3   Smokeless tobacco: Never  Vaping Use   Vaping status: Never Used  Substance and Sexual Activity   Alcohol use: No    Alcohol/week: 0.0 standard drinks of alcohol   Drug use: No   Sexual activity: Yes  Other Topics Concern   Not on file  Social History Narrative   Drinks about 1 large cup of coffee a day    Social Drivers of Corporate investment banker Strain: Low Risk  (06/05/2023)   Overall Financial Resource Strain (CARDIA)    Difficulty of Paying Living Expenses: Not hard at all  Food Insecurity: No Food Insecurity (06/05/2023)   Hunger Vital Sign    Worried About Running Out of Food in the Last Year: Never true    Ran Out of Food in the Last Year: Never true  Transportation Needs: No Transportation Needs (06/05/2023)   PRAPARE - Administrator, Civil Service (Medical): No    Lack of Transportation (Non-Medical): No  Physical Activity: Sufficiently Active (06/05/2023)   Exercise Vital Sign    Days of Exercise per Week: 5 days    Minutes of Exercise per Session: 40 min  Stress: No Stress Concern Present (06/05/2023)   [employer] of Occupational Health - Occupational Stress Questionnaire    Feeling of Stress : Only a little  Social Connections: Unknown (06/05/2023)   Social Connection and Isolation Panel [NHANES]    Frequency of Communication with Friends and Family: More than three times a week    Frequency of Social Gatherings with Friends and Family:  Once a week    Attends Religious Services: Patient declined    Database administrator or Organizations: No    Attends Engineer, structural: Not on file    Marital Status: Married  Catering manager Violence: Not on  file    Review of Systems Per HPI.   Objective:   Vitals:   06/06/23 1139  BP: 98/60  Pulse: 62  Temp: 98.6 F (37 C)  TempSrc: Temporal  SpO2: 98%  Weight: 175 lb (79.4 kg)  Height: 6\' 2"  (1.88 m)     Physical Exam Vitals reviewed.  Constitutional:      Appearance: He is well-developed.  HENT:     Head: Normocephalic and atraumatic.  Neck:     Vascular: No carotid bruit or JVD.  Cardiovascular:     Rate and Rhythm: Normal rate and regular rhythm.     Heart sounds: Normal heart sounds. No murmur heard. Pulmonary:     Effort: Pulmonary effort is normal.     Breath sounds: Normal breath sounds. No rales.  Musculoskeletal:     Cervical back: No rigidity.     Right lower leg: No edema.     Left lower leg: No edema.  Skin:    General: Skin is warm and dry.  Neurological:     Mental Status: He is alert and oriented to person, place, and time.  Psychiatric:        Mood and Affect: Mood normal.        Behavior: Behavior normal.        Thought Content: Thought content normal.        Assessment & Plan:  Gustavo Dispenza is a 58 y.o. male . Sweating abnormality - Plan: Testosterone, CBC, Basic metabolic panel with GFR, TSH History of depression - Plan: TSH Irritability Cold intolerance - Plan: Testosterone, CBC, Basic metabolic panel with GFR, TSH  - I think it is reasonable to check some labs above including testosterone (discussed need for repeat testing between 8 and 10 AM if low and to verify testing at least 2 times), TSH, electrolytes, CBC with sweating other symptoms as above.  However I am suspicious that the irritability, mood changes may be the underlying depression that has been experienced previously but has flared slightly.   Decreased running which was probably a good coping technique for him previously may have contributed.  He is back into exercise which should help.  We discussed returning to a low-dose SSRI, consider Paxil or Zoloft but for now we will continue to monitor.  Option to call to start one of those meds and recheck next few weeks at scheduled appointment.  RTC precautions given.  No orders of the defined types were placed in this encounter.  Patient Instructions  Thanks for coming in today.  If any concerns on your labs I will let you know.  Restarting running should continue to help with stress management, but as we discussed sometimes depression symptoms will return and I would consider a low-dose of an SSRI like Paxil which you have taken previously or Zoloft.  Let me know and I am happy to send in a low-dose of that if you are not continuing to improve the next few weeks.  Can check and see how things are going after my second visit as well.  Take care!    Signed,   Meredith Staggers, MD [facility], [facility] 06/06/23 12:05 PM

## 2023-06-11 ENCOUNTER — Encounter: Payer: Self-pay | Admitting: Family Medicine

## 2023-06-12 NOTE — Addendum Note (Signed)
 Addended by: Eldred Manges on: 06/12/2023 09:21 AM   Modules accepted: Orders

## 2023-06-17 ENCOUNTER — Other Ambulatory Visit (INDEPENDENT_AMBULATORY_CARE_PROVIDER_SITE_OTHER)

## 2023-06-17 DIAGNOSIS — E876 Hypokalemia: Secondary | ICD-10-CM

## 2023-06-17 DIAGNOSIS — R7989 Other specified abnormal findings of blood chemistry: Secondary | ICD-10-CM

## 2023-06-17 LAB — BASIC METABOLIC PANEL WITH GFR
BUN: 17 mg/dL (ref 6–23)
CO2: 28 meq/L (ref 19–32)
Calcium: 9 mg/dL (ref 8.4–10.5)
Chloride: 104 meq/L (ref 96–112)
Creatinine, Ser: 1.12 mg/dL (ref 0.40–1.50)
GFR: 73.89 mL/min (ref >60.00)
Glucose, Bld: 83 mg/dL (ref 70–99)
Potassium: 5 meq/L (ref 3.5–5.1)
Sodium: 140 meq/L (ref 135–145)

## 2023-06-17 LAB — TESTOSTERONE: Testosterone: 344.46 ng/dL (ref 300.00–890.00)

## 2023-06-18 ENCOUNTER — Encounter: Payer: Self-pay | Admitting: Family Medicine

## 2023-07-04 ENCOUNTER — Ambulatory Visit: Payer: BC Managed Care – PPO | Admitting: Family Medicine

## 2023-07-04 ENCOUNTER — Encounter: Payer: Self-pay | Admitting: Family Medicine

## 2023-07-04 VITALS — BP 116/60 | HR 57 | Temp 98.1°F | Ht 74.0 in | Wt 171.0 lb

## 2023-07-04 DIAGNOSIS — N401 Enlarged prostate with lower urinary tract symptoms: Secondary | ICD-10-CM | POA: Diagnosis not present

## 2023-07-04 DIAGNOSIS — Z8659 Personal history of other mental and behavioral disorders: Secondary | ICD-10-CM

## 2023-07-04 DIAGNOSIS — J301 Allergic rhinitis due to pollen: Secondary | ICD-10-CM | POA: Diagnosis not present

## 2023-07-04 DIAGNOSIS — R351 Nocturia: Secondary | ICD-10-CM

## 2023-07-04 MED ORDER — TAMSULOSIN HCL 0.4 MG PO CAPS: ORAL_CAPSULE | ORAL | 1 refills | Status: DC

## 2023-07-04 NOTE — Patient Instructions (Signed)
 Based on recent labs I think we can hold off on lab work today.  Continue Flonase  for allergies, albuterol  if needed but glad to hear that allergies are improving.  Continue Flomax  same dose.  Glad to hear running has been helpful in managing stress but as we discussed we certainly have the option of a low-dose SSRI or medication to help with stress, or mood symptoms.  Let me know if you would like to start that med.  Keep an eye on weight, it is down a few pounds today but that could be due to running, and with more exercise in the spring/summer which is common.  If you have any further decline in weight, or any new symptoms such as abdominal pain, change in stools, new fatigue or other new symptoms let me know right away.  I do not expect that to occur.  Follow-up in 6 months for physical but let me know if you have any questions in the meantime.  Take care!

## 2023-07-04 NOTE — Progress Notes (Signed)
 Subjective:  Patient ID: Brian Morrison, male    DOB: 1965-07-20  Age: 58 y.o. MRN: [id number]  CC:  Chief Complaint  Patient presents with   Medical Management of Chronic Issues    Pt is well, notes no concerns     HPI Brian Morrison presents for   Emotional/body temperature changes Discussed at his April 4 visit.  Prior history of depression treated with duloxetine , had also taken Paxil  years prior.  Some stressors with mother's health issues earlier in the year but was improving.  Had felt cold natured prior to 6 months.  Previously had been a runner every other day with stress management from running, but had stopped running in October then recently restarted in February.  Had noticed improvement in mood lability since restarting running.  Testosterone  was borderline low at 280, but normalized on repeat testing.  Borderline potassium initially that normalized on repeat testing.  Remainder of BMP was normal, CBC normal, TSH normal.  Option of low-dose SSRI but planned on continued monitoring at his April visit.  Feeling ok. Back running - helps with stress, but still some stress. Declines new meds at at this time. Eating well.  No fever/night sweats/bone pain.  Running daily.   Wt Readings from Last 3 Encounters:  07/04/23 171 lb (77.6 kg)  06/06/23 175 lb (79.4 kg)  02/03/23 176 lb 9.6 oz (80.1 kg)    Allergic rhinitis with reactive airway Treated with Flonase , albuterol  as needed - better with less pollen recently.  Flonase  for allergies has been effective and no need for albuterol  recently.   BPH with nocturia Treated with Flomax , working well.  Denies any lightheadedness or side effects. Still working well.   HM: declines recommended vaccines, Hep C or HIV.   Beach trip in summer - [place] for a week over 4th of July.   History Patient Active Problem List   Diagnosis Date Noted   Ingrown toenail of right foot 09/17/2019   Morton's neuroma of right foot  09/17/2019   Asthma with exacerbation 07/18/2015   Allergic rhinitis 07/18/2015   Past Medical History:  Diagnosis Date   Allergic rhinitis    Allergy    Anxiety    Asthma    Depression    Past Surgical History:  Procedure Laterality Date   JOINT REPLACEMENT     R foot 3rd meditarsel   No Known Allergies Prior to Admission medications   Medication Sig Start Date End Date Taking? Authorizing Provider  fluticasone  (FLONASE ) 50 MCG/ACT nasal spray SHAKE LIQUID AND USE 2 SPRAYS IN EACH NOSTRIL EVERY DAY 02/02/20  Yes Benjiman Bras, MD  tamsulosin  (FLOMAX ) 0.4 MG CAPS capsule TAKE 1 TO 2 CAPSULES(0.4 TO 0.8 MG) BY MOUTH DAILY Patient not taking: Reported on 07/04/2023 12/20/22   Benjiman Bras, MD   Social History   Socioeconomic History   Marital status: Married    Spouse name: Not on file   Number of children: 4   Years of education: College   Highest education level: Some college, no degree  Occupational History   Not on file  Tobacco Use   Smoking status: Former    Current packs/day: 0.00    Types: Cigarettes    Start date: 12/03/1997    Quit date: 02/1998    Years since quitting: 25.4   Smokeless tobacco: Never  Vaping Use   Vaping status: Never Used  Substance and Sexual Activity   Alcohol use: No    Alcohol/week:  0.0 standard drinks of alcohol   Drug use: No   Sexual activity: Yes  Other Topics Concern   Not on file  Social History Narrative   Drinks about 1 large cup of coffee a day    Social Drivers of Corporate investment banker Strain: Low Risk  (06/05/2023)   Overall Financial Resource Strain (CARDIA)    Difficulty of Paying Living Expenses: Not hard at all  Food Insecurity: No Food Insecurity (06/05/2023)   Hunger Vital Sign    Worried About Running Out of Food in the Last Year: Never true    Ran Out of Food in the Last Year: Never true  Transportation Needs: No Transportation Needs (06/05/2023)   PRAPARE - Administrator, Civil Service  (Medical): No    Lack of Transportation (Non-Medical): No  Physical Activity: Sufficiently Active (06/05/2023)   Exercise Vital Sign    Days of Exercise per Week: 5 days    Minutes of Exercise per Session: 40 min  Stress: No Stress Concern Present (06/05/2023)   [employer] of Occupational Health - Occupational Stress Questionnaire    Feeling of Stress : Only a little  Social Connections: Unknown (06/05/2023)   Social Connection and Isolation Panel [NHANES]    Frequency of Communication with Friends and Family: More than three times a week    Frequency of Social Gatherings with Friends and Family: Once a week    Attends Religious Services: Patient declined    Database administrator or Organizations: No    Attends Engineer, structural: Not on file    Marital Status: Married  Catering manager Violence: Not on file    Review of Systems  Constitutional:  Negative for fatigue and unexpected weight change.  Eyes:  Negative for visual disturbance.  Respiratory:  Negative for cough, chest tightness and shortness of breath.   Cardiovascular:  Negative for chest pain, palpitations and leg swelling.  Gastrointestinal:  Negative for abdominal pain and blood in stool.  Neurological:  Negative for dizziness, light-headedness and headaches.     Objective:   Vitals:   07/04/23 0842  BP: 116/60  Pulse: (!) 57  Temp: 98.1 F (36.7 C)  TempSrc: Temporal  SpO2: 96%  Weight: 171 lb (77.6 kg)  Height: 6\' 2"  (1.88 m)     Physical Exam Vitals reviewed.  Constitutional:      Appearance: He is well-developed.  HENT:     Head: Normocephalic and atraumatic.  Neck:     Vascular: No carotid bruit or JVD.  Cardiovascular:     Rate and Rhythm: Normal rate and regular rhythm.     Heart sounds: Normal heart sounds. No murmur heard. Pulmonary:     Effort: Pulmonary effort is normal.     Breath sounds: Normal breath sounds. No rales.  Musculoskeletal:     Right lower leg: No edema.      Left lower leg: No edema.  Skin:    General: Skin is warm and dry.  Neurological:     Mental Status: He is alert and oriented to person, place, and time.  Psychiatric:        Mood and Affect: Mood normal.      Assessment & Plan:  Brian Morrison is a 58 y.o. male . Allergic rhinitis due to pollen, unspecified seasonality  - Well-controlled with Flonase , has albuterol  if needed.  No changes.  Benign prostatic hyperplasia with nocturia - Plan: tamsulosin  (FLOMAX ) 0.4 MG CAPS capsule  -  Stable with Flomax , denies side effects.  Continue same  History of depression  - Improved with running, still some stressors but feels like he is handling this appropriately at this time and declines any meds.  Weight is down 4 pounds but could be related to running, increased activity, denies other associated symptoms or concerning symptoms.  Did recommend he continue to monitor weight and if any further decline should follow-up to discuss other testing, especially if any new symptoms.  Understanding expressed.  Previous labs overall reassuring including repeat BMP, testosterone .  4-month follow-up for physical.  Meds ordered this encounter  Medications   tamsulosin  (FLOMAX ) 0.4 MG CAPS capsule    Sig: TAKE 1 TO 2 CAPSULES(0.4 TO 0.8 MG) BY MOUTH DAILY    Dispense:  180 capsule    Refill:  1   Patient Instructions  Based on recent labs I think we can hold off on lab work today.  Continue Flonase  for allergies, albuterol  if needed but glad to hear that allergies are improving.  Continue Flomax  same dose.  Glad to hear running has been helpful in managing stress but as we discussed we certainly have the option of a low-dose SSRI or medication to help with stress, or mood symptoms.  Let me know if you would like to start that med.  Keep an eye on weight, it is down a few pounds today but that could be due to running, and with more exercise in the spring/summer which is common.  If you have any further  decline in weight, or any new symptoms such as abdominal pain, change in stools, new fatigue or other new symptoms let me know right away.  I do not expect that to occur.  Follow-up in 6 months for physical but let me know if you have any questions in the meantime.  Take care!    Signed,   Caro Christmas, MD [facility], [facility] 07/04/23 9:26 AM

## 2023-10-27 DIAGNOSIS — H5213 Myopia, bilateral: Secondary | ICD-10-CM | POA: Diagnosis not present

## 2024-01-07 ENCOUNTER — Encounter: Payer: Self-pay | Admitting: Gastroenterology

## 2024-01-07 ENCOUNTER — Ambulatory Visit: Admitting: Family Medicine

## 2024-01-07 ENCOUNTER — Encounter: Payer: Self-pay | Admitting: Family Medicine

## 2024-01-07 VITALS — BP 114/62 | HR 51 | Temp 98.4°F | Resp 16 | Ht 73.5 in | Wt 174.6 lb

## 2024-01-07 DIAGNOSIS — R351 Nocturia: Secondary | ICD-10-CM | POA: Diagnosis not present

## 2024-01-07 DIAGNOSIS — Z125 Encounter for screening for malignant neoplasm of prostate: Secondary | ICD-10-CM

## 2024-01-07 DIAGNOSIS — N401 Enlarged prostate with lower urinary tract symptoms: Secondary | ICD-10-CM

## 2024-01-07 DIAGNOSIS — Z130 Encounter for screening for diseases of the blood and blood-forming organs and certain disorders involving the immune mechanism: Secondary | ICD-10-CM | POA: Diagnosis not present

## 2024-01-07 DIAGNOSIS — Z23 Encounter for immunization: Secondary | ICD-10-CM

## 2024-01-07 DIAGNOSIS — Z0000 Encounter for general adult medical examination without abnormal findings: Secondary | ICD-10-CM | POA: Diagnosis not present

## 2024-01-07 DIAGNOSIS — Z13220 Encounter for screening for lipoid disorders: Secondary | ICD-10-CM | POA: Diagnosis not present

## 2024-01-07 DIAGNOSIS — Z131 Encounter for screening for diabetes mellitus: Secondary | ICD-10-CM

## 2024-01-07 DIAGNOSIS — J301 Allergic rhinitis due to pollen: Secondary | ICD-10-CM

## 2024-01-07 LAB — CBC
HCT: 43.6 % (ref 39.0–52.0)
Hemoglobin: 14.6 g/dL (ref 13.0–17.0)
MCHC: 33.5 g/dL (ref 30.0–36.0)
MCV: 91.2 fl (ref 78.0–100.0)
Platelets: 202 K/uL (ref 150.0–400.0)
RBC: 4.78 Mil/uL (ref 4.22–5.81)
RDW: 13 % (ref 11.5–15.5)
WBC: 4.7 K/uL (ref 4.0–10.5)

## 2024-01-07 LAB — COMPREHENSIVE METABOLIC PANEL WITH GFR
ALT: 25 U/L (ref 0–53)
AST: 21 U/L (ref 0–37)
Albumin: 4.3 g/dL (ref 3.5–5.2)
Alkaline Phosphatase: 44 U/L (ref 39–117)
BUN: 15 mg/dL (ref 6–23)
CO2: 31 meq/L (ref 19–32)
Calcium: 8.7 mg/dL (ref 8.4–10.5)
Chloride: 102 meq/L (ref 96–112)
Creatinine, Ser: 1.11 mg/dL (ref 0.40–1.50)
GFR: 74.4 mL/min (ref >60.00)
Glucose, Bld: 85 mg/dL (ref 70–99)
Potassium: 5.1 meq/L (ref 3.5–5.1)
Sodium: 139 meq/L (ref 135–145)
Total Bilirubin: 1 mg/dL (ref 0.2–1.2)
Total Protein: 6.3 g/dL (ref 6.0–8.3)

## 2024-01-07 LAB — LIPID PANEL
Cholesterol: 154 mg/dL (ref 0–200)
HDL: 59.4 mg/dL (ref >39.00)
LDL Cholesterol: 82 mg/dL (ref 0–99)
NonHDL: 94.22
Total CHOL/HDL Ratio: 3
Triglycerides: 62 mg/dL (ref 0.0–149.0)
VLDL: 12.4 mg/dL (ref 0.0–40.0)

## 2024-01-07 LAB — PSA: PSA: 0.28 ng/mL (ref 0.10–4.00)

## 2024-01-07 MED ORDER — TAMSULOSIN HCL 0.4 MG PO CAPS: ORAL_CAPSULE | ORAL | 3 refills | Status: AC

## 2024-01-07 NOTE — Progress Notes (Signed)
 Subjective:  Patient ID: Brian Morrison, male    DOB: January 24, 1966  Age: 58 y.o. MRN: [id number]  CC:  Chief Complaint  Patient presents with   Annual Exam    Pt is doing well, pt is fasting     HPI Brian Morrison presents for Annual Exam  No health changes.   PCP, me Ophthalmology, Dr. Robinson, office visit August 25.  Allergic rhinitis with reactive airway Flonase  as needed, albuterol  as needed.  Typically with increased pollen.  Stable recently. Still has rx's if needed.   BPH with nocturia, treated with Flomax , still working well. Nocturia 1-2 times per night. Flomax  once per night. Has not tried 2 tabs yet.   Mood symptoms, better temperature changes.  Discussed in April and then again in May.  Borderline testosterone  and potassium that improved on repeat testing.  Labs were reassuring.  Low-dose SSRI as an option but was doing better back with exercise and stress management at his May visit. Still doing ok. Still exercising. Pulled calf muscle recently - taking some time off running.      01/07/2024    8:54 AM 07/04/2023    8:41 AM 06/06/2023   12:45 PM 06/06/2023   12:44 PM 02/03/2023   10:44 AM  Depression screen PHQ 2/9  Decreased Interest 0 0 0 0 0  Down, Depressed, Hopeless 0 0 0 0 0  PHQ - 2 Score 0 0 0 0 0  Altered sleeping 1 0 1  1  Tired, decreased energy 1 0 1  0  Change in appetite 0 0 0  0  Feeling bad or failure about yourself  0 0 0  0  Trouble concentrating 0 0 0  0  Moving slowly or fidgety/restless 0 0 0  0  Suicidal thoughts 0 0 0  0  PHQ-9 Score 2 0 2  1  Difficult doing work/chores Not difficult at all  Not difficult at all      Health Maintenance  Topic Date Due   HIV Screening  Never done   Hepatitis C Screening  Never done   Hepatitis B Vaccines 19-59 Average Risk (1 of 3 - 19+ 3-dose series) Never done   Zoster Vaccines- Shingrix (1 of 2) Never done   Pneumococcal Vaccine: 50+ Years (2 of 2 - PCV) 01/15/2020   Influenza Vaccine  10/03/2023    COVID-19 Vaccine (4 - 2025-26 season) 11/03/2023   Colonoscopy  02/09/2024   DTaP/Tdap/Td (2 - Td or Tdap) 09/16/2029   HPV VACCINES  Aged Out   Meningococcal B Vaccine  Aged Out  Colonoscopy in 02/2019.  5 year repeat. Plans to call/schedule.  Prostate: does have family history of prostate cancer The natural history of prostate cancer and ongoing controversy regarding screening and potential treatment outcomes of prostate cancer has been discussed with the patient. The meaning of a false positive PSA and a false negative PSA has been discussed. He indicates understanding of the limitations of this screening test and wishes to proceed with screening PSA testing. Lab Results  Component Value Date   PSA1 0.5 03/08/2020   PSA 0.32 12/20/2022   PSA 0.46 06/28/2014    Immunization History  Administered Date(s) Administered   Influenza, Seasonal, Injecte, Preservative Fre 12/20/2022   Influenza,inj,Quad PF,6+ Mos 01/15/2019, 01/04/2020, 12/26/2020, 01/03/2022   PFIZER(Purple Top)SARS-COV-2 Vaccination 05/15/2019, 06/05/2019, 02/02/2020   Pneumococcal Polysaccharide-23 01/15/2019   Tdap 09/17/2019  Flu vaccine today.  Prevnar 20 today.  Declines shingles,covid booster and hep b  vaccine.  Declines hiv/hep c screening.     No results found. Optho - recent visit as above.   Dental: yearly. No concerns.   Alcohol: none  Tobacco: none  Exercise: prior to calf strain - running every other day, some muscle exercise - pushups, pullups in evening.    History Patient Active Problem List   Diagnosis Date Noted   Ingrown toenail of right foot 09/17/2019   Morton's neuroma of right foot 09/17/2019   Asthma with exacerbation 07/18/2015   Allergic rhinitis 07/18/2015   Past Medical History:  Diagnosis Date   Allergic rhinitis    Allergy    Anxiety    Asthma    Depression    Past Surgical History:  Procedure Laterality Date   JOINT REPLACEMENT     R foot 3rd meditarsel   No  Known Allergies Prior to Admission medications   Medication Sig Start Date End Date Taking? Authorizing Provider  fluticasone  (FLONASE ) 50 MCG/ACT nasal spray SHAKE LIQUID AND USE 2 SPRAYS IN EACH NOSTRIL EVERY DAY 02/02/20  Yes Levora Reyes SAUNDERS, MD  tamsulosin  (FLOMAX ) 0.4 MG CAPS capsule TAKE 1 TO 2 CAPSULES(0.4 TO 0.8 MG) BY MOUTH DAILY 07/04/23  Yes Levora Reyes SAUNDERS, MD  albuterol  (VENTOLIN  HFA) 108 (90 Base) MCG/ACT inhaler Inhale 2 puffs into the lungs every 6 (six) hours as needed. Patient not taking: Reported on 01/07/2024 05/15/20   [provider]  azithromycin  (ZITHROMAX ) 250 MG tablet Take 250 mg by mouth. Patient not taking: Reported on 01/07/2024 05/15/20   [provider]  Na Sulfate-K Sulfate-Mg Sulfate concentrate (SUPREP BOWEL PREP KIT) 17.5-3.13-1.6 GM/177ML SOLN Oral Patient not taking: Reported on 01/07/2024 05/15/20   [provider]  neomycin -polymyxin-hydrocortisone (CORTISPORIN) OTIC solution Otic Patient not taking: Reported on 01/07/2024 05/15/20   [provider]   Social History   Socioeconomic History   Marital status: Married    Spouse name: Not on file   Number of children: 4   Years of education: College   Highest education level: Associate degree: academic program  Occupational History   Not on file  Tobacco Use   Smoking status: Former    Current packs/day: 0.00    Types: Cigarettes    Start date: 12/03/1997    Quit date: 02/1998    Years since quitting: 25.9   Smokeless tobacco: Never  Vaping Use   Vaping status: Never Used  Substance and Sexual Activity   Alcohol use: No    Alcohol/week: 0.0 standard drinks of alcohol   Drug use: No   Sexual activity: Yes  Other Topics Concern   Not on file  Social History Narrative   Drinks about 1 large cup of coffee a day    Social Drivers of Corporate Investment Banker Strain: Low Risk  (01/05/2024)   Overall Financial Resource Strain (CARDIA)    Difficulty of Paying  Living Expenses: Not hard at all  Food Insecurity: No Food Insecurity (01/05/2024)   Hunger Vital Sign    Worried About Running Out of Food in the Last Year: Never true    Ran Out of Food in the Last Year: Never true  Transportation Needs: No Transportation Needs (01/05/2024)   PRAPARE - Administrator, Civil Service (Medical): No    Lack of Transportation (Non-Medical): No  Physical Activity: Sufficiently Active (01/05/2024)   Exercise Vital Sign    Days of Exercise per Week: 4 days    Minutes of Exercise per Session:  50 min  Stress: No Stress Concern Present (01/05/2024)   [employer] of Occupational Health - Occupational Stress Questionnaire    Feeling of Stress: Only a little  Social Connections: Unknown (01/05/2024)   Social Connection and Isolation Panel    Frequency of Communication with Friends and Family: More than three times a week    Frequency of Social Gatherings with Friends and Family: Once a week    Attends Religious Services: More than 4 times per year    Active Member of [employer] or Organizations: No    Attends Engineer, Structural: Not on file    Marital Status: Not on file  Intimate Partner Violence: Not on file    Review of Systems  13 point review of systems per patient health survey noted.  Negative other than as indicated above or in HPI.   Objective:   Vitals:   01/07/24 0849  BP: 114/62  Pulse: (!) 51  Resp: 16  Temp: 98.4 F (36.9 C)  TempSrc: Temporal  SpO2: 100%  Weight: 174 lb 9.6 oz (79.2 kg)  Height: 6' 1.5 (1.867 m)     Physical Exam Vitals reviewed.  Constitutional:      Appearance: He is well-developed.  HENT:     Head: Normocephalic and atraumatic.     Right Ear: External ear normal.     Left Ear: External ear normal.  Eyes:     Conjunctiva/sclera: Conjunctivae normal.     Pupils: Pupils are equal, round, and reactive to light.  Neck:     Thyroid : No thyromegaly.  Cardiovascular:     Rate and Rhythm:  Normal rate and regular rhythm.     Heart sounds: Normal heart sounds.  Pulmonary:     Effort: Pulmonary effort is normal. No respiratory distress.     Breath sounds: Normal breath sounds. No wheezing.  Abdominal:     General: There is no distension.     Palpations: Abdomen is soft.     Tenderness: There is no abdominal tenderness.  Musculoskeletal:        General: No tenderness. Normal range of motion.     Cervical back: Normal range of motion and neck supple.     Comments: Minimal discomfort at the right mid calf, no defect, Achilles nontender.  Ambulating without assistive device.  Lymphadenopathy:     Cervical: No cervical adenopathy.  Skin:    General: Skin is warm and dry.  Neurological:     Mental Status: He is alert and oriented to person, place, and time.     Deep Tendon Reflexes: Reflexes are normal and symmetric.  Psychiatric:        Behavior: Behavior normal.        Assessment & Plan:  Srihaan Mastrangelo is a 58 y.o. male . Annual physical exam  - -anticipatory guidance as below in AVS, screening labs above. Health maintenance items as above in HPI discussed/recommended as applicable.   Allergic rhinitis due to pollen, unspecified seasonality  - Stable with Flonase , albuterol , okay to refill if needed.  Intermittent use.  Benign prostatic hyperplasia with nocturia - Plan: PSA Screening for malignant neoplasm of prostate - Plan: PSA  - Check PSA.  Continue Flomax  for nocturia with option of 0.8 mg dosing with potential side effects discussed including dizziness/lightheadedness, retrograde ejaculation.  RTC precautions, consider urology eval if worsening symptoms.  Screening for hyperlipidemia - Plan: Comprehensive metabolic panel with GFR, Lipid panel  Screening for diabetes mellitus  - Check CMP.  Screening, anemia, deficiency, iron - Plan: CBC  Needs flu shot - Plan: Flu vaccine trivalent PF, 6mos and older(Flulaval,Afluria,Fluarix,Fluzone)  Need for  vaccination against Streptococcus pneumoniae - Plan: Pneumococcal conjugate vaccine 20-valent (Prevnar 20)   Meds ordered this encounter  Medications   tamsulosin  (FLOMAX ) 0.4 MG CAPS capsule    Sig: TAKE 1 TO 2 CAPSULES(0.4 TO 0.8 MG) BY MOUTH DAILY    Dispense:  180 capsule    Refill:  3   Patient Instructions  Thank you for coming in today. No change in medications at this time. If there are any concerns on your bloodwork, I will let you know. Take care!  Preventive Care 98-47 Years Old, Male Preventive care refers to lifestyle choices and visits with your health care provider that can promote health and wellness. Preventive care visits are also called wellness exams. What can I expect for my preventive care visit? Counseling During your preventive care visit, your health care provider may ask about your: Medical history, including: Past medical problems. Family medical history. Current health, including: Emotional well-being. Home life and relationship well-being. Sexual activity. Lifestyle, including: Alcohol, nicotine or tobacco, and drug use. Access to firearms. Diet, exercise, and sleep habits. Safety issues such as seatbelt and bike helmet use. Sunscreen use. Work and work astronomer. Physical exam Your health care provider will check your: Height and weight. These may be used to calculate your BMI (body mass index). BMI is a measurement that tells if you are at a healthy weight. Waist circumference. This measures the distance around your waistline. This measurement also tells if you are at a healthy weight and may help predict your risk of certain diseases, such as type 2 diabetes and high blood pressure. Heart rate and blood pressure. Body temperature. Skin for abnormal spots. What immunizations do I need?  Vaccines are usually given at various ages, according to a schedule. Your health care provider will recommend vaccines for you based on your age, medical  history, and lifestyle or other factors, such as travel or where you work. What tests do I need? Screening Your health care provider may recommend screening tests for certain conditions. This may include: Lipid and cholesterol levels. Diabetes screening. This is done by checking your blood sugar (glucose) after you have not eaten for a while (fasting). Hepatitis B test. Hepatitis C test. HIV (human immunodeficiency virus) test. STI (sexually transmitted infection) testing, if you are at risk. Lung cancer screening. Prostate cancer screening. Colorectal cancer screening. Talk with your health care provider about your test results, treatment options, and if necessary, the need for more tests. Follow these instructions at home: Eating and drinking  Eat a diet that includes fresh fruits and vegetables, whole grains, lean protein, and low-fat dairy products. Take vitamin and mineral supplements as recommended by your health care provider. Do not drink alcohol if your health care provider tells you not to drink. If you drink alcohol: Limit how much you have to 0-2 drinks a day. Know how much alcohol is in your drink. In the [place] one drink equals one 12 oz bottle of beer (355 mL), one 5 oz glass of wine (148 mL), or one 1 oz glass of hard liquor (44 mL). Lifestyle Brush your teeth every morning and night with fluoride toothpaste. Floss one time each day. Exercise for at least 30 minutes 5 or more days each week. Do not use any products that contain nicotine or tobacco. These products include cigarettes, chewing tobacco, and vaping devices,  such as e-cigarettes. If you need help quitting, ask your health care provider. Do not use drugs. If you are sexually active, practice safe sex. Use a condom or other form of protection to prevent STIs. Take aspirin only as told by your health care provider. Make sure that you understand how much to take and what form to take. Work with your health care  provider to find out whether it is safe and beneficial for you to take aspirin daily. Find healthy ways to manage stress, such as: Meditation, yoga, or listening to music. Journaling. Talking to a trusted person. Spending time with friends and family. Minimize exposure to UV radiation to reduce your risk of skin cancer. Safety Always wear your seat belt while driving or riding in a vehicle. Do not drive: If you have been drinking alcohol. Do not ride with someone who has been drinking. When you are tired or distracted. While texting. If you have been using any mind-altering substances or drugs. Wear a helmet and other protective equipment during sports activities. If you have firearms in your house, make sure you follow all gun safety procedures. What's next? Go to your health care provider once a year for an annual wellness visit. Ask your health care provider how often you should have your eyes and teeth checked. Stay up to date on all vaccines. This information is not intended to replace advice given to you by your health care provider. Make sure you discuss any questions you have with your health care provider. Document Revised: 08/16/2020 Document Reviewed: 08/16/2020 Elsevier Patient Education  2024 Elsevier Inc.    Signed,   Reyes Pines, MD [facility], [facility] 01/07/24 9:27 AM

## 2024-01-07 NOTE — Patient Instructions (Addendum)
 Thank you for coming in today. No change in medications at this time. If there are any concerns on your bloodwork, I will let you know. Take care! Preventive Care 41-56 Years Old, Male Preventive care refers to lifestyle choices and visits with your health care provider that can promote health and wellness. Preventive care visits are also called wellness exams. What can I expect for my preventive care visit? Counseling During your preventive care visit, your health care provider may ask about your: Medical history, including: Past medical problems. Family medical history. Current health, including: Emotional well-being. Home life and relationship well-being. Sexual activity. Lifestyle, including: Alcohol, nicotine or tobacco, and drug use. Access to firearms. Diet, exercise, and sleep habits. Safety issues such as seatbelt and bike helmet use. Sunscreen use. Work and work Astronomer. Physical exam Your health care provider will check your: Height and weight. These may be used to calculate your BMI (body mass index). BMI is a measurement that tells if you are at a healthy weight. Waist circumference. This measures the distance around your waistline. This measurement also tells if you are at a healthy weight and may help predict your risk of certain diseases, such as type 2 diabetes and high blood pressure. Heart rate and blood pressure. Body temperature. Skin for abnormal spots. What immunizations do I need?  Vaccines are usually given at various ages, according to a schedule. Your health care provider will recommend vaccines for you based on your age, medical history, and lifestyle or other factors, such as travel or where you work. What tests do I need? Screening Your health care provider may recommend screening tests for certain conditions. This may include: Lipid and cholesterol levels. Diabetes screening. This is done by checking your blood sugar (glucose) after you have not  eaten for a while (fasting). Hepatitis B test. Hepatitis C test. HIV (human immunodeficiency virus) test. STI (sexually transmitted infection) testing, if you are at risk. Lung cancer screening. Prostate cancer screening. Colorectal cancer screening. Talk with your health care provider about your test results, treatment options, and if necessary, the need for more tests. Follow these instructions at home: Eating and drinking  Eat a diet that includes fresh fruits and vegetables, whole grains, lean protein, and low-fat dairy products. Take vitamin and mineral supplements as recommended by your health care provider. Do not drink alcohol if your health care provider tells you not to drink. If you drink alcohol: Limit how much you have to 0-2 drinks a day. Know how much alcohol is in your drink. In the [place] one drink equals one 12 oz bottle of beer (355 mL), one 5 oz glass of wine (148 mL), or one 1 oz glass of hard liquor (44 mL). Lifestyle Brush your teeth every morning and night with fluoride toothpaste. Floss one time each day. Exercise for at least 30 minutes 5 or more days each week. Do not use any products that contain nicotine or tobacco. These products include cigarettes, chewing tobacco, and vaping devices, such as e-cigarettes. If you need help quitting, ask your health care provider. Do not use drugs. If you are sexually active, practice safe sex. Use a condom or other form of protection to prevent STIs. Take aspirin  only as told by your health care provider. Make sure that you understand how much to take and what form to take. Work with your health care provider to find out whether it is safe and beneficial for you to take aspirin  daily. Find healthy ways to manage  stress, such as: Meditation, yoga, or listening to music. Journaling. Talking to a trusted person. Spending time with friends and family. Minimize exposure to UV radiation to reduce your risk of skin  cancer. Safety Always wear your seat belt while driving or riding in a vehicle. Do not drive: If you have been drinking alcohol. Do not ride with someone who has been drinking. When you are tired or distracted. While texting. If you have been using any mind-altering substances or drugs. Wear a helmet and other protective equipment during sports activities. If you have firearms in your house, make sure you follow all gun safety procedures. What's next? Go to your health care provider once a year for an annual wellness visit. Ask your health care provider how often you should have your eyes and teeth checked. Stay up to date on all vaccines. This information is not intended to replace advice given to you by your health care provider. Make sure you discuss any questions you have with your health care provider. Document Revised: 08/16/2020 Document Reviewed: 08/16/2020 Elsevier Patient Education  2024 [employer].

## 2024-01-08 ENCOUNTER — Ambulatory Visit: Payer: Self-pay | Admitting: Family Medicine

## 2024-02-09 ENCOUNTER — Ambulatory Visit: Admitting: *Deleted

## 2024-02-09 ENCOUNTER — Telehealth: Payer: Self-pay | Admitting: *Deleted

## 2024-02-09 VITALS — Ht 73.5 in | Wt 174.0 lb

## 2024-02-09 DIAGNOSIS — Z860100 Personal history of colon polyps, unspecified: Secondary | ICD-10-CM

## 2024-02-09 DIAGNOSIS — Z8371 Family history of adenomatous and serrated polyps: Secondary | ICD-10-CM

## 2024-02-09 MED ORDER — NA SULFATE-K SULFATE-MG SULF 17.5-3.13-1.6 GM/177ML PO SOLN: 1.0000 | Freq: Once | ORAL | 0 refills | Status: AC

## 2024-02-09 NOTE — Progress Notes (Signed)
 Pt's name and DOB verified at the beginning of the pre-visit with 2 identifiers  Pt denies any difficulty with ambulating,sitting, laying down or rolling side to side  Pt has no issues moving head neck or swallowing  No egg or soy allergy known to patient   No issues known to pt with past sedation  No FH of Malignant Hyperthermia  Pt is not on home 02   Pt is not on blood thinners   Pt denies issues with constipation   Pt has frequent issues with constipation RN instructed pt to use Miralax per bottles instructions a week before prep days. Pt states they will  Pt is not on dialysis  Pt denise any abnormal heart rhythms   Pt denies any upcoming cardiac testing  Patient's chart reviewed by Norleen Schillings CNRA prior to pre-visit and patient appropriate for the [facility].  Pre-visit completed and red dot placed by patient's name on their procedure day (on provider's schedule).    Visit by phone  Pt states weight is 174 lb  Pt given  both [facility] main # and MD on call # prior to instructions.  Informed pt to come in at the time discussed and is shown on PV instructions.  Pt instructed to use Singlecare.com or GoodRx for a price reduction on prep  Instructed pt where to find PV instructions in My Chart. . Instructed pt on all aspects of written instructions including med holds clothing to wear and foods to eat and not eat as well as after procedure legal restrictions and to call MD on call if needed.. Pt states understanding. Instructed pt to review instructions again prior to procedure and call main # given if has any questions or any issues. Pt states they will.

## 2024-02-09 NOTE — Telephone Encounter (Signed)
 Called to move PV time but pt wanted to do PV today

## 2024-02-10 ENCOUNTER — Encounter

## 2024-02-17 ENCOUNTER — Encounter: Payer: Self-pay | Admitting: Gastroenterology

## 2024-02-24 ENCOUNTER — Encounter: Payer: Self-pay | Admitting: Gastroenterology

## 2024-02-24 ENCOUNTER — Ambulatory Visit: Admitting: Gastroenterology

## 2024-02-24 VITALS — BP 123/72 | HR 60 | Temp 98.2°F | Resp 18 | Ht 73.5 in | Wt 174.0 lb

## 2024-02-24 DIAGNOSIS — K5730 Diverticulosis of large intestine without perforation or abscess without bleeding: Secondary | ICD-10-CM

## 2024-02-24 DIAGNOSIS — Z860101 Personal history of adenomatous and serrated colon polyps: Secondary | ICD-10-CM | POA: Diagnosis not present

## 2024-02-24 DIAGNOSIS — Z860100 Personal history of colon polyps, unspecified: Secondary | ICD-10-CM

## 2024-02-24 DIAGNOSIS — Z1211 Encounter for screening for malignant neoplasm of colon: Secondary | ICD-10-CM | POA: Diagnosis present

## 2024-02-24 DIAGNOSIS — K648 Other hemorrhoids: Secondary | ICD-10-CM | POA: Diagnosis not present

## 2024-02-24 MED ORDER — SODIUM CHLORIDE 0.9 % IV SOLN: 500.0000 mL | INTRAVENOUS | Status: DC

## 2024-02-24 NOTE — Progress Notes (Signed)
 Transferred to PACU via stretcher.  Not responding to stimulation at this time.  VSS upon leaving procedure room.

## 2024-02-24 NOTE — Patient Instructions (Signed)
 Thank you for letting us take care of your healthcare needs today. Please see handouts given to you on Diverticulosis and Hemorrhoids.    YOU HAD AN ENDOSCOPIC PROCEDURE TODAY AT THE [facility]:   Refer to the procedure report that was given to you for any specific questions about what was found during the examination.  If the procedure report does not answer your questions, please call your gastroenterologist to clarify.  If you requested that your care partner not be given the details of your procedure findings, then the procedure report has been included in a sealed envelope for you to review at your convenience later.  YOU SHOULD EXPECT: Some feelings of bloating in the abdomen. Passage of more gas than usual.  Walking can help get rid of the air that was put into your GI tract during the procedure and reduce the bloating. If you had a lower endoscopy (such as a colonoscopy or flexible sigmoidoscopy) you may notice spotting of blood in your stool or on the toilet paper. If you underwent a bowel prep for your procedure, you may not have a normal bowel movement for a few days.  Please Note:  You might notice some irritation and congestion in your nose or some drainage.  This is from the oxygen used during your procedure.  There is no need for concern and it should clear up in a day or so.  SYMPTOMS TO REPORT IMMEDIATELY:  Following lower endoscopy (colonoscopy or flexible sigmoidoscopy):  Excessive amounts of blood in the stool  Significant tenderness or worsening of abdominal pains  Swelling of the abdomen that is new, acute  Fever of 100F or higher   For urgent or emergent issues, a gastroenterologist can be reached at any hour by calling [phone]. Do not use MyChart messaging for urgent concerns.    DIET:  We do recommend a small meal at first, but then you may proceed to your regular diet.  Drink plenty of fluids but you should avoid alcoholic beverages for 24  hours.  ACTIVITY:  You should plan to take it easy for the rest of today and you should NOT DRIVE or use heavy machinery until tomorrow (because of the sedation medicines used during the test).    FOLLOW UP: Our staff will call the number listed on your records the next business day following your procedure.  We will call around 7:15- 8:00 am to check on you and address any questions or concerns that you may have regarding the information given to you following your procedure. If we do not reach you, we will leave a message.     If any biopsies were taken you will be contacted by phone or by letter within the next 1-3 weeks.  Please call us at [phone] if you have not heard about the biopsies in 3 weeks.    SIGNATURES/CONFIDENTIALITY: You and/or your care partner have signed paperwork which will be entered into your electronic medical record.  These signatures attest to the fact that that the information above on your After Visit Summary has been reviewed and is understood.  Full responsibility of the confidentiality of this discharge information lies with you and/or your care-partner.

## 2024-02-24 NOTE — Op Note (Signed)
 [facility] Patient Name: Brian Morrison Procedure Date: 02/24/2024 8:53 AM MRN: [id number] Endoscopist: Elspeth P. Leigh , MD, [id number] Age: 58 Referring MD:  Date of Birth: 1965/10/10 Gender: Male Account #: [id number] Procedure:                Colonoscopy Indications:              High risk colon cancer surveillance: Personal                            history of colonic polyps - sessile serrated polyp                            x 1 removed 02/2019 Medicines:                Monitored Anesthesia Care Procedure:                Pre-Anesthesia Assessment:                           - Prior to the procedure, a History and Physical                            was performed, and patient medications and                            allergies were reviewed. The patient's tolerance of                            previous anesthesia was also reviewed. The risks                            and benefits of the procedure and the sedation                            options and risks were discussed with the patient.                            All questions were answered, and informed consent                            was obtained. Prior Anticoagulants: The patient has                            taken no anticoagulant or antiplatelet agents. ASA                            Grade Assessment: II - A patient with mild systemic                            disease. After reviewing the risks and benefits,                            the patient was deemed in satisfactory condition to  undergo the procedure.                           After obtaining informed consent, the colonoscope                            was passed under direct vision. Throughout the                            procedure, the patient's blood pressure, pulse, and                            oxygen saturations were monitored continuously. The                            Olympus Scope SN [id number] was  introduced through the                            anus and advanced to the the cecum, identified by                            appendiceal orifice and ileocecal valve. The                            colonoscopy was performed without difficulty. The                            patient tolerated the procedure well. The quality                            of the bowel preparation was adequate. The                            ileocecal valve, appendiceal orifice, and rectum                            were photographed. Scope In: 9:13:07 AM Scope Out: 9:33:06 AM Scope Withdrawal Time: 0 hours 17 minutes 18 seconds  Total Procedure Duration: 0 hours 19 minutes 59 seconds  Findings:                 The perianal and digital rectal examinations were                            normal.                           Multiple diverticula were found in the sigmoid                            colon.                           A large amount of liquid stool was found in the  entire colon, making visualization difficult                            initially, particularly in the right colon. Lavage                            of the colon was performed using copious amounts of                            fluid, resulting in clearance with adequate                            visualization.                           Internal hemorrhoids were found during retroflexion.                           The exam was otherwise without abnormality. Complications:            No immediate complications. Estimated blood loss:                            None. Estimated Blood Loss:     Estimated blood loss: none. Impression:               - Diverticulosis in the sigmoid colon.                           - Stool in the entire examined colon leading to                            extensive lavage with adequate views obtained.                           - Internal hemorrhoids.                           - The  examination was otherwise normal.                           - No polyps. Recommendation:           - Patient has a contact number available for                            emergencies. The signs and symptoms of potential                            delayed complications were discussed with the                            patient. Return to normal activities tomorrow.                            Written discharge instructions were provided to the  patient.                           - Resume previous diet.                           - Continue present medications.                           - Repeat colonoscopy in 10 years for surveillance. Elspeth P. Jaslene Marsteller, MD 02/24/2024 9:39:11 AM This report has been signed electronically.

## 2024-02-24 NOTE — Progress Notes (Signed)
 Pt's states no medical or surgical changes since previsit or office visit.

## 2024-02-24 NOTE — Progress Notes (Signed)
 [facility] History and Physical   Primary Care Physician:  Levora Reyes SAUNDERS, MD   Reason for Procedure:   History of colon polyps  Plan:    colonoscopy     HPI: Brian Morrison is a 58 y.o. male  here for colonoscopy surveillance - SSP removed 02/2019 x 1.   Patient denies any bowel symptoms at this time. No family history of colon cancer known. Otherwise feels well without any cardiopulmonary symptoms.   I have discussed risks / benefits of anesthesia and endoscopic procedure with Brian Morrison and they wish to proceed with the exams as outlined today.   The patient was provided an opportunity to ask questions and all were answered. The patient agreed with the plan.    Past Medical History:  Diagnosis Date   Allergic rhinitis    Allergy    Anxiety    Asthma    Depression     Past Surgical History:  Procedure Laterality Date   COLONOSCOPY     JOINT REPLACEMENT     R foot 3rd meditarsel    Prior to Admission medications  Medication Sig Start Date End Date Taking? Authorizing Provider  fluticasone  (FLONASE ) 50 MCG/ACT nasal spray SHAKE LIQUID AND USE 2 SPRAYS IN EACH NOSTRIL EVERY DAY 02/02/20  Yes Levora Reyes SAUNDERS, MD  tamsulosin  (FLOMAX ) 0.4 MG CAPS capsule TAKE 1 TO 2 CAPSULES(0.4 TO 0.8 MG) BY MOUTH DAILY 01/07/24  Yes Levora Reyes SAUNDERS, MD    Current Outpatient Medications  Medication Sig Dispense Refill   fluticasone  (FLONASE ) 50 MCG/ACT nasal spray SHAKE LIQUID AND USE 2 SPRAYS IN EACH NOSTRIL EVERY DAY 16 g 5   tamsulosin  (FLOMAX ) 0.4 MG CAPS capsule TAKE 1 TO 2 CAPSULES(0.4 TO 0.8 MG) BY MOUTH DAILY 180 capsule 3   Current Facility-Administered Medications  Medication Dose Route Frequency Provider Last Rate Last Admin   0.9 %  sodium chloride  infusion  500 mL Intravenous Continuous Tiajuana Leppanen, Elspeth SQUIBB, MD        Allergies as of 02/24/2024   (No Known Allergies)    Family History  Problem Relation Age of Onset   COPD Mother     Colon polyps Father    Lung cancer Maternal Grandmother    Alzheimer's disease Maternal Grandfather    Heart disease Paternal Grandfather    Colon cancer Neg Hx    Esophageal cancer Neg Hx    Rectal cancer Neg Hx    Stomach cancer Neg Hx     Social History   Socioeconomic History   Marital status: Married    Spouse name: Not on file   Number of children: 4   Years of education: College   Highest education level: Associate degree: academic program  Occupational History   Not on file  Tobacco Use   Smoking status: Former    Current packs/day: 0.00    Types: Cigarettes    Start date: 12/03/1997    Quit date: 02/1998    Years since quitting: 26.0   Smokeless tobacco: Never  Vaping Use   Vaping status: Never Used  Substance and Sexual Activity   Alcohol use: No    Alcohol/week: 0.0 standard drinks of alcohol   Drug use: No   Sexual activity: Yes  Other Topics Concern   Not on file  Social History Narrative   Drinks about 1 large cup of coffee a day    Social Drivers of Health   Tobacco Use: Medium Risk (02/24/2024)   Patient History  Smoking Tobacco Use: Former    Smokeless Tobacco Use: Never    Passive Exposure: Not on file  Financial Resource Strain: Low Risk (01/05/2024)   Overall Financial Resource Strain (CARDIA)    Difficulty of Paying Living Expenses: Not hard at all  Food Insecurity: No Food Insecurity (01/05/2024)   Epic    Worried About Programme Researcher, Broadcasting/film/video in the Last Year: Never true    Ran Out of Food in the Last Year: Never true  Transportation Needs: No Transportation Needs (01/05/2024)   Epic    Lack of Transportation (Medical): No    Lack of Transportation (Non-Medical): No  Physical Activity: Sufficiently Active (01/05/2024)   Exercise Vital Sign    Days of Exercise per Week: 4 days    Minutes of Exercise per Session: 50 min  Stress: No Stress Concern Present (01/05/2024)   [employer] of Occupational Health - Occupational Stress  Questionnaire    Feeling of Stress: Only a little  Social Connections: Unknown (01/05/2024)   Social Connection and Isolation Panel    Frequency of Communication with Friends and Family: More than three times a week    Frequency of Social Gatherings with Friends and Family: Once a week    Attends Religious Services: More than 4 times per year    Active Member of [employer] or Organizations: No    Attends Banker Meetings: Not on file    Marital Status: Not on file  Intimate Partner Violence: Not on file  Depression (PHQ2-9): Low Risk (01/07/2024)   Depression (PHQ2-9)    PHQ-2 Score: 2  Alcohol Screen: Not on file  Housing: Low Risk (01/05/2024)   Epic    Unable to Pay for Housing in the Last Year: No    Number of Times Moved in the Last Year: 0    Homeless in the Last Year: No  Utilities: Not on file  Health Literacy: Not on file    Review of Systems: All other review of systems negative except as mentioned in the HPI.  Physical Exam: Vital signs BP (!) 141/79   Pulse (!) 58   Temp 98.2 F (36.8 C) (Temporal)   Ht 6' 1.5 (1.867 m)   Wt 174 lb (78.9 kg)   SpO2 99%   BMI 22.65 kg/m   General:   Alert,  Well-developed, pleasant and cooperative in NAD Lungs:  Clear throughout to auscultation.   Heart:  Regular rate and rhythm Abdomen:  Soft, nontender and nondistended.   Neuro/Psych:  Alert and cooperative. Normal mood and affect. A and O x 3  Marcey Naval, MD [facility]

## 2024-02-25 ENCOUNTER — Telehealth: Payer: Self-pay

## 2024-02-25 NOTE — Telephone Encounter (Signed)
 Attempted to reach patient for follow up phone call. No answer, left voicemail to contact Dr. Hassan office with any questions or concerns.

## 2025-01-10 ENCOUNTER — Encounter: Admitting: Family Medicine
